# Patient Record
Sex: Female | Born: 1950 | Race: Black or African American | Hispanic: No | Marital: Single | Smoking: Never smoker
Health system: Southern US, Community
[De-identification: ages and names within clinical notes are randomized; demographics above are authoritative.]

## PROBLEM LIST (undated history)

## (undated) DIAGNOSIS — D649 Anemia, unspecified: Secondary | ICD-10-CM

## (undated) DIAGNOSIS — Z8489 Family history of other specified conditions: Secondary | ICD-10-CM

## (undated) HISTORY — PX: ABDOMINAL HYSTERECTOMY: SHX81

---

## 2016-09-02 ENCOUNTER — Ambulatory Visit: Payer: Self-pay | Admitting: Orthopedic Surgery

## 2016-09-03 ENCOUNTER — Ambulatory Visit: Payer: Self-pay | Admitting: Orthopedic Surgery

## 2016-09-04 ENCOUNTER — Ambulatory Visit: Payer: Self-pay | Admitting: Orthopedic Surgery

## 2016-09-04 NOTE — H&P (Signed)
[name] is an 66 y.o. female.   Chief Complaint: back and L leg pain HPI: The patient is a 66 year old female who presents with back pain. The patient is here today in referral from Dr. Ethelene Hal. 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 Symptoms are reported to be located in the low back and Symptoms include pain, numbness, tingling and weakness (bilateral lower extremities). The pain radiates to the left posterior thigh and right posterior thigh. The patient describes the pain as sharp, aching and throbbing. The patient describes the severity of their symptoms as 9 / 10. Symptoms are exacerbated by standing. Current treatment includes nonsteroidal anti-inflammatory drugs (Ibuprofen) and activity modification. Prior to being seen today the patient was previously evaluated by Dr. Ethelene Hal. Past evaluation has included MRI of the lumbar spine. Past treatment has included nonsteroidal anti-inflammatory drugs, activity modification, physical therapy and chiropractic manipulation (years ago).  [name] is here for second opinion. Reports pain when she stands, better when she sits, mainly radiates on the left anterior thigh. She also has numbness and tingling in the bottom of the feet and the posterior aspect of the leg. She is here with her husband. She was seen at [facility]. She is seen by a doctor there. She wanted a second opinion and wanted more information. She has had an MRI. She has had x-rays. She has had formal supervised physical therapy, though that occurred prior to her x-rays or her MRI.  I reviewed her outside medical records, an extensive review. MRI done on 06/11/2016 indicating foraminal disc protrusion at L3-4 contacting the L3 root with severe stenosis.  She had a grade 1 listhesis at L3-4. Disc degeneration at L5-S1.  Osteoarthritis was noted in her right hip.  Dr. Sueanne Margarita discussed these findings with her and gave her options including a transforaminal injection, physical therapy, activity modification, and possible surgery.  She is here with her husband. She is not quite interested in epidural steroid injections. She does not have significant groin pain.  REVIEW OF SYSTEMS Review of systems is negative for fevers, chest pain, shortness of breath, unexplained recent weight loss, loss of bowel or bladder function, burning with urination, joint swelling, rashes, weakness or numbness, difficulty with balance, easy bruising, excessive thirst or frequent urination.  Family History Cerebrovascular Accident  Maternal Grandfather, Maternal Grandmother. Diabetes Mellitus  Mother. Hypertension  Father, Mother. Rheumatoid Arthritis  Mother. First Degree Relatives   Social History Tobacco use  Never smoker. 08/14/2016 Children  1 Current drinker  08/14/2016: Currently drinks wine only occasionally per week Current work status  working part time Exercise  Exercises weekly; does gym / [employer] Living situation  live alone Marital status  divorced No history of drug/alcohol rehab  Not under pain contract  Number of flights of stairs before winded  greater than 5 Tobacco / smoke exposure  08/14/2016: no  Medication History Ibuprofen (200MG  Capsule, Oral) Active.  Pregnancy / Birth History Pregnant  no  Past Surgical History Appendectomy  Hysterectomy  partial (non-cancerous)  Past Medical Hx No pertinent past medical hx  Allergies: NKDA  Review of Systems  Constitutional: Negative.   HENT: Negative.   Eyes: Negative.   Respiratory: Negative.   Cardiovascular: Negative.   Gastrointestinal: Negative.   Genitourinary: Negative.   Musculoskeletal: Positive for back pain.  Skin: Negative.  Neurological: Positive for sensory change and focal weakness.   Psychiatric/Behavioral: Negative.     There were no vitals taken for this visit. Physical Exam  Constitutional: She is oriented to person, place, and time. She appears well-developed.  HENT:  Head: Normocephalic.  Eyes: Pupils are equal, round, and reactive to light.  Neck: Normal range of motion.  Cardiovascular: Normal rate.   Respiratory: Effort normal.  GI: Soft.  Musculoskeletal:  Healthy, in no distress. Mood and affect is appropriate. Walks with slightly antalgic gait, forward flexed. Straight leg raise is actually negative bilaterally. She has some slight hip flexor weakness on the left compared to the right.  She has pain with extension and relieved with forward flexion of the lumbar spine.  Lumbar spine exam reveals no evidence of soft tissue swelling, deformity or skin ecchymosis. On palpation there is no tenderness of the lumbar spine. No flank pain with percussion. The abdomen is soft and nontender. Nontender over the trochanters. No cellulitis or lymphadenopathy.  Good range of motion of the lumbar spine without associated pain. Motor is 5/5 including EHL, tibialis anterior, plantar flexion, quadriceps and hamstrings. Patient is normoreflexic. There is no Babinski or clonus. Sensory exam is intact to light touch. Patient has good distal pulses. No DVT. No pain and normal range of motion without instability of the knees and ankles.  Painless range of motion of the cervical spine.  Neurological: She is alert and oriented to person, place, and time. She has normal reflexes.  Skin: Skin is warm and dry.    AP, lateral, flexion and extension radiographs of the lumbar spine demonstrate a mild thoracolumbar scoliosis, grade 1 listhesis at L3-4, minimal instability in flexion and extension. End-stage disc degeneration at L5-S, moderate at L3-4.  I reviewed her outside MRI from [facility]. Severe multifactorial stenosis at L3-4. Neural foraminal stenosis with disc  protrusion at L3-4.  Assessment/Plan 1. 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 2. Spondylolisthesis at L3-4. 3. Disc protrusion at L3-4, left. 4. 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 5. Osteoarthrosis of the right hip.  We had extensive discussion concerning current pathology, relevant anatomy, and treatment options reviewing her treatment to date, her x-rays, and MRI. Forty five minutes dedicated to this full discussion and in presentation. We discussed Williams flexion program in terms of physical therapy and at length activity modification to avoid extension and precipitation of her claudication by favoring forward flexion.  Additionally, we did discuss analgesics as well as possible epidural versus selective nerve root blocks at L3 and L4. They are not interested in epidural steroid injections. In terms of surgical intervention, I feel that predominant of her symptoms are secondary to the severe stenosis at L3-4, L4-5, and L5-S1 that do not appear to be the main pain generator. Her main problem is neurogenic claudication in terms of the left lower extremity radicular pain, dysesthesia in the plantar aspect of the feet that basically resolves when she sits and presents when she stands. I would feel any surgical invention would required decompression at L3-4, minimum lateral mass fusion, and possible instrumentation. She is otherwise pretty healthy and low comorbidities. I have spent considerable time in discussions. She will contemplate her options. We will call her in a week. I would like to determine whether she would be a candidate for an XLIF. I will discuss this with Dr. Shon Baton and then will call them back and discuss further options. We discussed surgical procedure, time in the hospital, and  the postoperative course.  Dorothy SparkBISSELL, Kianah Harries M., PA-C for Dr. Shelle IronBeane 09/04/2016, 9:20 AM

## 2016-09-15 NOTE — Patient Instructions (Signed)
Randa EvensJudy Mosso  09/15/2016   Your procedure is scheduled on: 09/24/2016    [facility]  [place] to 3rd floor to  Short Stay Center at    0630 AM.  Call this number if you have problems the morning of surgery [phone]   Remember: ONLY 1 PERSON MAY GO WITH YOU TO SHORT STAY TO GET  READY MORNING OF YOUR SURGERY.  Do not eat food or drink liquids :After Midnight.     Take these medicines the morning of surgery with A SIP OF WATER: none                                 You may not have any metal on your body including hair pins and              piercings  Do not wear jewelry, make-up, lotions, powders or perfumes, deodorant             Do not wear nail polish.  Do not shave  48 hours prior to surgery.               Do not bring valuables to the hospital. [place] IS NOT             RESPONSIBLE   FOR VALUABLES.  Contacts, dentures or bridgework may not be worn into surgery.  Leave suitcase in the car. After surgery it may be brought to your room.                      Please read over the following fact sheets you were given: _____________________________________________________________________             [facility] - Pre[facility] Before surgery, you can play an important role.  Because skin is not sterile, your skin needs to be as free of germs as possible.  You can reduce the number of germs on your skin by washing with CHG (chlorahexidine gluconate) soap before surgery.  CHG is an antiseptic cleaner which kills germs and bonds with the skin to continue killing germs even after washing. Please DO NOT use if you have an allergy to CHG or antibacterial soaps.  If your skin becomes reddened/irritated stop using the CHG and inform your nurse when you arrive at Short Stay. Do not shave (including legs and underarms) for at least 48 hours prior to the first CHG shower.  You may shave your face/neck. Please follow  these instructions carefully:  1.  Shower with CHG Soap the night before surgery and the  morning of Surgery.  2.  If you choose to wash your hair, wash your hair first as usual with your  normal  shampoo.  3.  After you shampoo, rinse your hair and body thoroughly to remove the  shampoo.                           4.  Use CHG as you would any other liquid soap.  You can apply chg directly  to the skin and wash                       Gently with a scrungie or clean washcloth.  5.  Apply the  CHG Soap to your body ONLY FROM THE NECK DOWN.   Do not use on face/ open                           Wound or open sores. Avoid contact with eyes, ears mouth and genitals (private parts).                       Wash face,  Genitals (private parts) with your normal soap.             6.  Wash thoroughly, paying special attention to the area where your surgery  will be performed.  7.  Thoroughly rinse your body with warm water from the neck down.  8.  DO NOT shower/wash with your normal soap after using and rinsing off  the CHG Soap.                9.  Pat yourself dry with a clean towel.            10.  Wear clean pajamas.            11.  Place clean sheets on your bed the night of your first shower and do not  sleep with pets. Day of Surgery : Do not apply any lotions/deodorants the morning of surgery.  Please wear clean clothes to the hospital/surgery center.  FAILURE TO FOLLOW THESE INSTRUCTIONS MAY RESULT IN THE CANCELLATION OF YOUR SURGERY PATIENT SIGNATURE_________________________________  NURSE SIGNATURE__________________________________  ________________________________________________________________________   Adam Phenix  An incentive spirometer is a tool that can help keep your lungs clear and active. This tool measures how well you are filling your lungs with each breath. Taking long deep breaths may help reverse or decrease the chance of developing breathing (pulmonary) problems  (especially infection) following:  A long period of time when you are unable to move or be active. BEFORE THE PROCEDURE   If the spirometer includes an indicator to show your best effort, your nurse or respiratory therapist will set it to a desired goal.  If possible, sit up straight or lean slightly forward. Try not to slouch.  Hold the incentive spirometer in an upright position. INSTRUCTIONS FOR USE  1. Sit on the edge of your bed if possible, or sit up as far as you can in bed or on a chair. 2. Hold the incentive spirometer in an upright position. 3. Breathe out normally. 4. Place the mouthpiece in your mouth and seal your lips tightly around it. 5. Breathe in slowly and as deeply as possible, raising the piston or the ball toward the top of the column. 6. Hold your breath for 3-5 seconds or for as long as possible. Allow the piston or ball to fall to the bottom of the column. 7. Remove the mouthpiece from your mouth and breathe out normally. 8. Rest for a few seconds and repeat Steps 1 through 7 at least 10 times every 1-2 hours when you are awake. Take your time and take a few normal breaths between deep breaths. 9. The spirometer may include an indicator to show your best effort. Use the indicator as a goal to work toward during each repetition. 10. After each set of 10 deep breaths, practice coughing to be sure your lungs are clear. If you have an incision (the cut made at the time of surgery), support your incision when coughing by placing a pillow or rolled up  towels firmly against it. Once you are able to get out of bed, walk around indoors and cough well. You may stop using the incentive spirometer when instructed by your caregiver.  RISKS AND COMPLICATIONS  Take your time so you do not get dizzy or light-headed.  If you are in pain, you may need to take or ask for pain medication before doing incentive spirometry. It is harder to take a deep breath if you are having  pain. AFTER USE  Rest and breathe slowly and easily.  It can be helpful to keep track of a log of your progress. Your caregiver can provide you with a simple table to help with this. If you are using the spirometer at home, follow these instructions: SEEK MEDICAL CARE IF:   You are having difficultly using the spirometer.  You have trouble using the spirometer as often as instructed.  Your pain medication is not giving enough relief while using the spirometer.  You develop fever of 100.5 F (38.1 C) or higher. SEEK IMMEDIATE MEDICAL CARE IF:   You cough up bloody sputum that had not been present before.  You develop fever of 102 F (38.9 C) or greater.  You develop worsening pain at or near the incision site. MAKE SURE YOU:   Understand these instructions.  Will watch your condition.  Will get help right away if you are not doing well or get worse. Document Released: 11/17/2006 Document Revised: 09/29/2011 Document Reviewed: 01/18/2007 [facility] Patient Information 2014 [employer] [place].   ________________________________________________________________________

## 2016-09-17 ENCOUNTER — Encounter (HOSPITAL_COMMUNITY): Payer: Self-pay | Admitting: *Deleted

## 2016-09-17 ENCOUNTER — Encounter (HOSPITAL_COMMUNITY)
Admission: RE | Admit: 2016-09-17 | Discharge: 2016-09-17 | Disposition: A | Discharge status: Home / Self Care | Payer: Medicare Other | Source: Ambulatory Visit | Attending: Specialist | Admitting: Specialist

## 2016-09-17 ENCOUNTER — Ambulatory Visit (HOSPITAL_COMMUNITY)
Admission: RE | Admit: 2016-09-17 | Discharge: 2016-09-17 | Disposition: A | Discharge status: Home / Self Care | Payer: Medicare Other | Source: Ambulatory Visit | Attending: Orthopedic Surgery | Admitting: Orthopedic Surgery

## 2016-09-17 DIAGNOSIS — Z791 Long term (current) use of non-steroidal anti-inflammatories (NSAID): Secondary | ICD-10-CM | POA: Diagnosis not present

## 2016-09-17 DIAGNOSIS — M5126 Other intervertebral disc displacement, lumbar region: Secondary | ICD-10-CM | POA: Insufficient documentation

## 2016-09-17 DIAGNOSIS — M48062 Spinal stenosis, lumbar region with neurogenic claudication: Secondary | ICD-10-CM | POA: Diagnosis present

## 2016-09-17 DIAGNOSIS — M4316 Spondylolisthesis, lumbar region: Secondary | ICD-10-CM | POA: Diagnosis not present

## 2016-09-17 HISTORY — DX: Family history of other specified conditions: Z84.89

## 2016-09-17 HISTORY — DX: Anemia, unspecified: D64.9

## 2016-09-17 LAB — CBC
HCT: 41.1 % (ref 36.0–46.0)
Hemoglobin: 13.7 g/dL (ref 12.0–15.0)
MCH: 30.2 pg (ref 26.0–34.0)
MCHC: 33.3 g/dL (ref 30.0–36.0)
MCV: 90.7 fL (ref 78.0–100.0)
Platelets: 219 10*3/uL (ref 150–400)
RBC: 4.53 MIL/uL (ref 3.87–5.11)
RDW: 13.2 % (ref 11.5–15.5)
WBC: 5.3 10*3/uL (ref 4.0–10.5)

## 2016-09-17 LAB — BASIC METABOLIC PANEL
Anion gap: 6 (ref 5–15)
BUN: 18 mg/dL (ref 6–20)
CO2: 27 mmol/L (ref 22–32)
CREATININE: 0.78 mg/dL (ref 0.44–1.00)
Calcium: 9.8 mg/dL (ref 8.9–10.3)
Chloride: 108 mmol/L (ref 101–111)
GFR calc non Af Amer: >60 mL/min (ref >60)
Glucose, Bld: 95 mg/dL (ref 65–99)
Potassium: 4.3 mmol/L (ref 3.5–5.1)
SODIUM: 141 mmol/L (ref 135–145)

## 2016-09-17 LAB — ABO/RH: ABO/RH(D): O POS

## 2016-09-17 LAB — SURGICAL PCR SCREEN
MRSA, PCR: NEGATIVE
Staphylococcus aureus: NEGATIVE

## 2016-09-24 ENCOUNTER — Ambulatory Visit (HOSPITAL_COMMUNITY): Payer: Medicare Other | Admitting: Anesthesiology

## 2016-09-24 ENCOUNTER — Encounter (HOSPITAL_COMMUNITY): Payer: Self-pay | Admitting: Orthopedic Surgery

## 2016-09-24 ENCOUNTER — Ambulatory Visit (HOSPITAL_COMMUNITY): Payer: Medicare Other

## 2016-09-24 ENCOUNTER — Encounter (HOSPITAL_COMMUNITY)
Admission: RE | Disposition: A | Discharge status: Home / Self Care | Payer: Self-pay | Source: Ambulatory Visit | Attending: Specialist

## 2016-09-24 ENCOUNTER — Observation Stay (HOSPITAL_COMMUNITY)
Admission: RE | Admit: 2016-09-24 | Discharge: 2016-09-27 | Disposition: A | Discharge status: Home / Self Care | Payer: Medicare Other | Source: Ambulatory Visit | Attending: Specialist | Admitting: Specialist

## 2016-09-24 DIAGNOSIS — M4316 Spondylolisthesis, lumbar region: Secondary | ICD-10-CM | POA: Diagnosis not present

## 2016-09-24 DIAGNOSIS — M48062 Spinal stenosis, lumbar region with neurogenic claudication: Secondary | ICD-10-CM | POA: Diagnosis not present

## 2016-09-24 DIAGNOSIS — Z791 Long term (current) use of non-steroidal anti-inflammatories (NSAID): Secondary | ICD-10-CM | POA: Insufficient documentation

## 2016-09-24 DIAGNOSIS — M549 Dorsalgia, unspecified: Secondary | ICD-10-CM

## 2016-09-24 DIAGNOSIS — M48061 Spinal stenosis, lumbar region without neurogenic claudication: Secondary | ICD-10-CM

## 2016-09-24 DIAGNOSIS — M5126 Other intervertebral disc displacement, lumbar region: Secondary | ICD-10-CM

## 2016-09-24 HISTORY — PX: LUMBAR LAMINECTOMY/DECOMPRESSION MICRODISCECTOMY: SHX5026

## 2016-09-24 SURGERY — LUMBAR LAMINECTOMY/DECOMPRESSION MICRODISCECTOMY 1 LEVEL
Anesthesia: General | Site: Back | Laterality: Bilateral

## 2016-09-24 MED ORDER — ROCURONIUM BROMIDE 10 MG/ML (PF) SYRINGE
PREFILLED_SYRINGE | INTRAVENOUS | Status: DC | PRN
  Administered 2016-09-24 (×2): 10 mg via INTRAVENOUS
  Administered 2016-09-24: 40 mg via INTRAVENOUS

## 2016-09-24 MED ORDER — ONDANSETRON HCL 4 MG PO TABS
4.0000 mg | ORAL_TABLET | Freq: Four times a day (QID) | ORAL | Status: DC | PRN
  Administered 2016-09-24: 4 mg via ORAL
  Filled 2016-09-24: qty 1

## 2016-09-24 MED ORDER — SUGAMMADEX SODIUM 200 MG/2ML IV SOLN
INTRAVENOUS | Status: DC | PRN
  Administered 2016-09-24: 200 mg via INTRAVENOUS

## 2016-09-24 MED ORDER — ONDANSETRON HCL 4 MG/2ML IJ SOLN
4.0000 mg | Freq: Four times a day (QID) | INTRAMUSCULAR | Status: DC | PRN
  Administered 2016-09-24 – 2016-09-25 (×2): 4 mg via INTRAVENOUS
  Filled 2016-09-24 (×2): qty 2

## 2016-09-24 MED ORDER — LACTATED RINGERS IV SOLN
INTRAVENOUS | Status: DC
  Administered 2016-09-24 (×3): via INTRAVENOUS

## 2016-09-24 MED ORDER — ROCURONIUM BROMIDE 50 MG/5ML IV SOSY
PREFILLED_SYRINGE | INTRAVENOUS | Status: AC
  Filled 2016-09-24: qty 5

## 2016-09-24 MED ORDER — DOXYLAMINE SUCCINATE (SLEEP) 25 MG PO TABS
25.0000 mg | ORAL_TABLET | Freq: Every day | ORAL | Status: DC
  Filled 2016-09-24 (×3): qty 1

## 2016-09-24 MED ORDER — SODIUM CHLORIDE 0.9 % IR SOLN
Status: DC | PRN
  Administered 2016-09-24: 500 mL

## 2016-09-24 MED ORDER — PHENYLEPHRINE 40 MCG/ML (10ML) SYRINGE FOR IV PUSH (FOR BLOOD PRESSURE SUPPORT)
PREFILLED_SYRINGE | INTRAVENOUS | Status: DC | PRN
  Administered 2016-09-24 (×4): 80 ug via INTRAVENOUS

## 2016-09-24 MED ORDER — PROPOFOL 10 MG/ML IV BOLUS
INTRAVENOUS | Status: DC | PRN
  Administered 2016-09-24: 160 mg via INTRAVENOUS

## 2016-09-24 MED ORDER — MIDAZOLAM HCL 5 MG/5ML IJ SOLN
INTRAMUSCULAR | Status: DC | PRN
  Administered 2016-09-24: 2 mg via INTRAVENOUS

## 2016-09-24 MED ORDER — ALUM & MAG HYDROXIDE-SIMETH 200-200-20 MG/5ML PO SUSP: 30.0000 mL | Freq: Four times a day (QID) | ORAL | Status: DC | PRN

## 2016-09-24 MED ORDER — MIDAZOLAM HCL 2 MG/2ML IJ SOLN
INTRAMUSCULAR | Status: AC
  Filled 2016-09-24: qty 2

## 2016-09-24 MED ORDER — THROMBIN 5000 UNITS EX SOLR
CUTANEOUS | Status: AC
  Filled 2016-09-24: qty 10000

## 2016-09-24 MED ORDER — ONDANSETRON HCL 4 MG/2ML IJ SOLN
INTRAMUSCULAR | Status: DC | PRN
  Administered 2016-09-24: 4 mg via INTRAVENOUS

## 2016-09-24 MED ORDER — RISAQUAD PO CAPS
1.0000 | ORAL_CAPSULE | Freq: Every day | ORAL | Status: DC
  Administered 2016-09-25 – 2016-09-27 (×3): 1 via ORAL
  Filled 2016-09-24 (×3): qty 1

## 2016-09-24 MED ORDER — PHENYLEPHRINE 40 MCG/ML (10ML) SYRINGE FOR IV PUSH (FOR BLOOD PRESSURE SUPPORT)
PREFILLED_SYRINGE | INTRAVENOUS | Status: AC
  Filled 2016-09-24: qty 10

## 2016-09-24 MED ORDER — BUPIVACAINE-EPINEPHRINE 0.5% -1:200000 IJ SOLN
INTRAMUSCULAR | Status: DC | PRN
  Administered 2016-09-24: 13 mL

## 2016-09-24 MED ORDER — ONDANSETRON HCL 4 MG/2ML IJ SOLN
INTRAMUSCULAR | Status: AC
  Filled 2016-09-24: qty 2

## 2016-09-24 MED ORDER — PROPOFOL 10 MG/ML IV BOLUS
INTRAVENOUS | Status: AC
  Filled 2016-09-24: qty 20

## 2016-09-24 MED ORDER — DEXAMETHASONE SODIUM PHOSPHATE 10 MG/ML IJ SOLN
INTRAMUSCULAR | Status: DC | PRN
  Administered 2016-09-24: 10 mg via INTRAVENOUS

## 2016-09-24 MED ORDER — MENTHOL 3 MG MT LOZG: 1.0000 | LOZENGE | OROMUCOSAL | Status: DC | PRN

## 2016-09-24 MED ORDER — PHENYLEPHRINE HCL 10 MG/ML IJ SOLN
INTRAMUSCULAR | Status: AC
  Filled 2016-09-24: qty 1

## 2016-09-24 MED ORDER — POLYETHYLENE GLYCOL 3350 17 G PO PACK: 17.0000 g | PACK | Freq: Every day | ORAL | Status: DC | PRN

## 2016-09-24 MED ORDER — PHENYLEPHRINE HCL 10 MG/ML IJ SOLN
INTRAVENOUS | Status: DC | PRN
  Administered 2016-09-24: 25 ug/min via INTRAVENOUS

## 2016-09-24 MED ORDER — SODIUM CHLORIDE 0.9 % IR SOLN
Status: AC
  Filled 2016-09-24: qty 500000

## 2016-09-24 MED ORDER — DOCUSATE SODIUM 100 MG PO CAPS: 100.0000 mg | ORAL_CAPSULE | Freq: Two times a day (BID) | ORAL | 1 refills | Status: AC | PRN

## 2016-09-24 MED ORDER — MAGNESIUM CITRATE PO SOLN: 1.0000 | Freq: Once | ORAL | Status: DC | PRN

## 2016-09-24 MED ORDER — HYDROMORPHONE HCL 1 MG/ML IJ SOLN
0.2500 mg | INTRAMUSCULAR | Status: DC | PRN
  Administered 2016-09-24 (×2): 0.5 mg via INTRAVENOUS

## 2016-09-24 MED ORDER — FENTANYL CITRATE (PF) 100 MCG/2ML IJ SOLN
INTRAMUSCULAR | Status: DC | PRN
  Administered 2016-09-24: 50 ug via INTRAVENOUS

## 2016-09-24 MED ORDER — SUGAMMADEX SODIUM 200 MG/2ML IV SOLN
INTRAVENOUS | Status: AC
  Filled 2016-09-24: qty 2

## 2016-09-24 MED ORDER — DEXAMETHASONE SODIUM PHOSPHATE 10 MG/ML IJ SOLN
INTRAMUSCULAR | Status: AC
  Filled 2016-09-24: qty 1

## 2016-09-24 MED ORDER — THROMBIN 5000 UNITS EX SOLR
CUTANEOUS | Status: DC | PRN
  Administered 2016-09-24: 10000 mL via TOPICAL

## 2016-09-24 MED ORDER — FENTANYL CITRATE (PF) 100 MCG/2ML IJ SOLN
INTRAMUSCULAR | Status: AC
  Filled 2016-09-24: qty 2

## 2016-09-24 MED ORDER — OXYCODONE-ACETAMINOPHEN 5-325 MG PO TABS: 1.0000 | ORAL_TABLET | ORAL | 0 refills | Status: AC | PRN

## 2016-09-24 MED ORDER — ACETAMINOPHEN 10 MG/ML IV SOLN
1000.0000 mg | Freq: Once | INTRAVENOUS | Status: AC
  Administered 2016-09-24: 1000 mg via INTRAVENOUS

## 2016-09-24 MED ORDER — BISACODYL 5 MG PO TBEC: 5.0000 mg | DELAYED_RELEASE_TABLET | Freq: Every day | ORAL | Status: DC | PRN

## 2016-09-24 MED ORDER — ACETAMINOPHEN 325 MG PO TABS
650.0000 mg | ORAL_TABLET | ORAL | Status: DC | PRN
  Administered 2016-09-25 – 2016-09-26 (×5): 650 mg via ORAL
  Filled 2016-09-24 (×6): qty 2

## 2016-09-24 MED ORDER — LIDOCAINE 2% (20 MG/ML) 5 ML SYRINGE
INTRAMUSCULAR | Status: DC | PRN
  Administered 2016-09-24: 100 mg via INTRAVENOUS

## 2016-09-24 MED ORDER — OXYCODONE HCL 5 MG PO TABS
5.0000 mg | ORAL_TABLET | ORAL | Status: DC | PRN
  Administered 2016-09-24 – 2016-09-27 (×5): 5 mg via ORAL
  Administered 2016-09-27: 10 mg via ORAL
  Filled 2016-09-24 (×2): qty 1
  Filled 2016-09-24: qty 2
  Filled 2016-09-24 (×2): qty 1
  Filled 2016-09-24: qty 2

## 2016-09-24 MED ORDER — CEFAZOLIN SODIUM-DEXTROSE 2-4 GM/100ML-% IV SOLN
2.0000 g | Freq: Three times a day (TID) | INTRAVENOUS | Status: AC
  Administered 2016-09-24 – 2016-09-25 (×3): 2 g via INTRAVENOUS
  Filled 2016-09-24 (×3): qty 100

## 2016-09-24 MED ORDER — HYDROMORPHONE HCL 1 MG/ML IJ SOLN
INTRAMUSCULAR | Status: AC
  Administered 2016-09-24: 0.5 mg via INTRAVENOUS
  Filled 2016-09-24: qty 1

## 2016-09-24 MED ORDER — SUCCINYLCHOLINE CHLORIDE 200 MG/10ML IV SOSY
PREFILLED_SYRINGE | INTRAVENOUS | Status: DC | PRN
  Administered 2016-09-24: 120 mg via INTRAVENOUS

## 2016-09-24 MED ORDER — CEFAZOLIN SODIUM-DEXTROSE 2-4 GM/100ML-% IV SOLN
2.0000 g | INTRAVENOUS | Status: AC
  Administered 2016-09-24: 2 g via INTRAVENOUS

## 2016-09-24 MED ORDER — SUCCINYLCHOLINE CHLORIDE 200 MG/10ML IV SOSY
PREFILLED_SYRINGE | INTRAVENOUS | Status: AC
  Filled 2016-09-24: qty 10

## 2016-09-24 MED ORDER — ACETAMINOPHEN 10 MG/ML IV SOLN
INTRAVENOUS | Status: AC
  Filled 2016-09-24: qty 100

## 2016-09-24 MED ORDER — HYDROMORPHONE HCL 1 MG/ML IJ SOLN
INTRAMUSCULAR | Status: AC
  Filled 2016-09-24: qty 1

## 2016-09-24 MED ORDER — MEPERIDINE HCL 50 MG/ML IJ SOLN: 6.2500 mg | INTRAMUSCULAR | Status: DC | PRN

## 2016-09-24 MED ORDER — PROMETHAZINE HCL 25 MG/ML IJ SOLN: 6.2500 mg | INTRAMUSCULAR | Status: DC | PRN

## 2016-09-24 MED ORDER — POLYETHYLENE GLYCOL 3350 17 G PO PACK: 17.0000 g | PACK | Freq: Every day | ORAL | 0 refills | Status: AC

## 2016-09-24 MED ORDER — KCL IN DEXTROSE-NACL 20-5-0.45 MEQ/L-%-% IV SOLN
INTRAVENOUS | Status: AC
  Administered 2016-09-24 – 2016-09-25 (×2): via INTRAVENOUS
  Filled 2016-09-24 (×3): qty 1000

## 2016-09-24 MED ORDER — METHOCARBAMOL 1000 MG/10ML IJ SOLN
500.0000 mg | Freq: Four times a day (QID) | INTRAVENOUS | Status: DC | PRN
  Administered 2016-09-24: 500 mg via INTRAVENOUS
  Filled 2016-09-24: qty 5
  Filled 2016-09-24: qty 550

## 2016-09-24 MED ORDER — PHENOL 1.4 % MT LIQD: 1.0000 | OROMUCOSAL | Status: DC | PRN

## 2016-09-24 MED ORDER — HYDROMORPHONE HCL 1 MG/ML IJ SOLN
0.5000 mg | INTRAMUSCULAR | Status: DC | PRN
  Filled 2016-09-24: qty 0.5

## 2016-09-24 MED ORDER — LIDOCAINE 2% (20 MG/ML) 5 ML SYRINGE
INTRAMUSCULAR | Status: AC
  Filled 2016-09-24: qty 5

## 2016-09-24 MED ORDER — CEFAZOLIN SODIUM-DEXTROSE 2-4 GM/100ML-% IV SOLN
INTRAVENOUS | Status: AC
  Filled 2016-09-24: qty 100

## 2016-09-24 MED ORDER — DOCUSATE SODIUM 100 MG PO CAPS
100.0000 mg | ORAL_CAPSULE | Freq: Two times a day (BID) | ORAL | Status: DC
  Administered 2016-09-24 – 2016-09-27 (×6): 100 mg via ORAL
  Filled 2016-09-24 (×6): qty 1

## 2016-09-24 MED ORDER — ACETAMINOPHEN 650 MG RE SUPP: 650.0000 mg | RECTAL | Status: DC | PRN

## 2016-09-24 MED ORDER — METHOCARBAMOL 500 MG PO TABS
500.0000 mg | ORAL_TABLET | Freq: Four times a day (QID) | ORAL | Status: DC | PRN
  Administered 2016-09-24 – 2016-09-27 (×7): 500 mg via ORAL
  Filled 2016-09-24 (×7): qty 1

## 2016-09-24 MED ORDER — BUPIVACAINE-EPINEPHRINE (PF) 0.5% -1:200000 IJ SOLN
INTRAMUSCULAR | Status: AC
  Filled 2016-09-24: qty 30

## 2016-09-24 SURGICAL SUPPLY — 49 items
BAG ZIPLOCK 12X15 (MISCELLANEOUS) IMPLANT
CLEANER TIP ELECTROSURG 2X2 (MISCELLANEOUS) ×2 IMPLANT
CLOTH 2% CHLOROHEXIDINE 3PK (PERSONAL CARE ITEMS) ×2 IMPLANT
DRAPE MICROSCOPE LEICA (MISCELLANEOUS) ×2 IMPLANT
DRAPE POUCH INSTRU U-SHP 10X18 (DRAPES) IMPLANT
DRAPE SHEET LG 3/4 BI-LAMINATE (DRAPES) ×2 IMPLANT
DRAPE SURG 17X11 SM STRL (DRAPES) ×2 IMPLANT
DRAPE UTILITY XL STRL (DRAPES) ×2 IMPLANT
DRSG AQUACEL AG ADV 3.5X 4 (GAUZE/BANDAGES/DRESSINGS) IMPLANT
DRSG AQUACEL AG ADV 3.5X 6 (GAUZE/BANDAGES/DRESSINGS) ×4 IMPLANT
DURAPREP 26ML APPLICATOR (WOUND CARE) ×2 IMPLANT
DURASEAL SPINE SEALANT 3ML (MISCELLANEOUS) IMPLANT
ELECT BLADE TIP CTD 4 INCH (ELECTRODE) ×2 IMPLANT
ELECT REM PT RETURN 9FT ADLT (ELECTROSURGICAL) ×2
ELECTRODE REM PT RTRN 9FT ADLT (ELECTROSURGICAL) ×1 IMPLANT
GLOVE BIOGEL PI IND STRL 7.0 (GLOVE) ×1 IMPLANT
GLOVE BIOGEL PI INDICATOR 7.0 (GLOVE) ×1
GLOVE SURG SS PI 7.0 STRL IVOR (GLOVE) ×2 IMPLANT
GLOVE SURG SS PI 7.5 STRL IVOR (GLOVE) ×4 IMPLANT
GLOVE SURG SS PI 8.0 STRL IVOR (GLOVE) ×4 IMPLANT
GOWN STRL REUS W/TWL XL LVL3 (GOWN DISPOSABLE) ×4 IMPLANT
GRAFT IC CHAMBER LG (Bone Implant) ×2 IMPLANT
HEMOSTAT SPONGE AVITENE ULTRA (HEMOSTASIS) IMPLANT
IV CATH 14GX2 1/4 (CATHETERS) ×2 IMPLANT
KIT BASIN OR (CUSTOM PROCEDURE TRAY) ×2 IMPLANT
KIT POSITIONING SURG ANDREWS (MISCELLANEOUS) ×2 IMPLANT
MANIFOLD NEPTUNE II (INSTRUMENTS) ×2 IMPLANT
NEEDLE SPNL 18GX3.5 QUINCKE PK (NEEDLE) ×4 IMPLANT
PACK LAMINECTOMY ORTHO (CUSTOM PROCEDURE TRAY) ×2 IMPLANT
PATTIES SURGICAL .5 X.5 (GAUZE/BANDAGES/DRESSINGS) IMPLANT
PATTIES SURGICAL .75X.75 (GAUZE/BANDAGES/DRESSINGS) ×2 IMPLANT
PATTIES SURGICAL 1X1 (DISPOSABLE) IMPLANT
RUBBERBAND STERILE (MISCELLANEOUS) ×4 IMPLANT
SPONGE LAP 4X18 X RAY DECT (DISPOSABLE) ×2 IMPLANT
SPONGE SURGIFOAM ABS GEL 100 (HEMOSTASIS) ×2 IMPLANT
STAPLER VISISTAT (STAPLE) ×2 IMPLANT
STRIP CLOSURE SKIN 1/2X4 (GAUZE/BANDAGES/DRESSINGS) ×2 IMPLANT
SUT NURALON 4 0 TR CR/8 (SUTURE) IMPLANT
SUT PROLENE 3 0 PS 2 (SUTURE) ×2 IMPLANT
SUT VIC AB 1 CT1 27 (SUTURE)
SUT VIC AB 1 CT1 27XBRD ANTBC (SUTURE) IMPLANT
SUT VIC AB 1-0 CT2 27 (SUTURE) ×4 IMPLANT
SUT VIC AB 2-0 CT1 27 (SUTURE)
SUT VIC AB 2-0 CT1 TAPERPNT 27 (SUTURE) IMPLANT
SUT VIC AB 2-0 CT2 27 (SUTURE) ×2 IMPLANT
SYR 3ML LL SCALE MARK (SYRINGE) ×2 IMPLANT
TOWEL OR 17X26 10 PK STRL BLUE (TOWEL DISPOSABLE) ×2 IMPLANT
TOWEL OR NON WOVEN STRL DISP B (DISPOSABLE) ×2 IMPLANT
YANKAUER SUCT BULB TIP NO VENT (SUCTIONS) ×2 IMPLANT

## 2016-09-24 NOTE — Anesthesia Procedure Notes (Signed)
Procedure Name: Intubation Date/Time: 09/24/2016 8:34 AM Performed by: Lind Covert Pre-anesthesia Checklist: Patient identified, Timeout performed, Emergency Drugs available, Patient being monitored and Suction available Patient Re-evaluated:Patient Re-evaluated prior to inductionOxygen Delivery Method: Circle system utilized Preoxygenation: Pre-oxygenation with 100% oxygen Intubation Type: IV induction Laryngoscope Size: Mac and 4 Grade View: Grade I Tube type: Oral Tube size: 7.0 mm Number of attempts: 1 Airway Equipment and Method: Stylet Placement Confirmation: ETT inserted through vocal cords under direct vision,  positive ETCO2 and breath sounds checked- equal and bilateral Secured at: 22 cm Tube secured with: Tape Dental Injury: Teeth and Oropharynx as per pre-operative assessment

## 2016-09-24 NOTE — Anesthesia Preprocedure Evaluation (Signed)
Anesthesia Evaluation  Patient identified by MRN, date of birth, ID band Patient awake    Reviewed: Allergy & Precautions, NPO status , Patient's Chart, lab work & pertinent test results  Airway Mallampati: II  TM Distance: >3 FB Neck ROM: Full    Dental no notable dental hx.    Pulmonary neg pulmonary ROS,    Pulmonary exam normal breath sounds clear to auscultation       Cardiovascular negative cardio ROS Normal cardiovascular exam Rhythm:Regular Rate:Normal     Neuro/Psych negative neurological ROS  negative psych ROS   GI/Hepatic negative GI ROS, Neg liver ROS,   Endo/Other  negative endocrine ROS  Renal/GU negative Renal ROS     Musculoskeletal negative musculoskeletal ROS (+)   Abdominal   Peds  Hematology negative hematology ROS (+)   Anesthesia Other Findings   Reproductive/Obstetrics negative OB ROS                             Anesthesia Physical Anesthesia Plan  ASA: II  Anesthesia Plan: General   Post-op Pain Management:    Induction: Intravenous  Airway Management Planned: Oral ETT  Additional Equipment:   Intra-op Plan:   Post-operative Plan: Extubation in OR  Informed Consent: I have reviewed the patients History and Physical, chart, labs and discussed the procedure including the risks, benefits and alternatives for the proposed anesthesia with the patient or authorized representative who has indicated his/her understanding and acceptance.   Dental advisory given  Plan Discussed with: CRNA  Anesthesia Plan Comments:         Anesthesia Quick Evaluation  

## 2016-09-24 NOTE — H&P (View-Only) (Signed)
[name] is an 66 y.o. female.   Chief Complaint: back and L leg pain HPI: The patient is a 66 year old female who presents with back pain. The patient is here today in referral from Dr. Ethelene Hal. 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 Symptoms are reported to be located in the low back and Symptoms include pain, numbness, tingling and weakness (bilateral lower extremities). The pain radiates to the left posterior thigh and right posterior thigh. The patient describes the pain as sharp, aching and throbbing. The patient describes the severity of their symptoms as 9 / 10. Symptoms are exacerbated by standing. Current treatment includes nonsteroidal anti-inflammatory drugs (Ibuprofen) and activity modification. Prior to being seen today the patient was previously evaluated by Dr. Ethelene Hal. Past evaluation has included MRI of the lumbar spine. Past treatment has included nonsteroidal anti-inflammatory drugs, activity modification, physical therapy and chiropractic manipulation (years ago).  [name] is here for second opinion. Reports pain when she stands, better when she sits, mainly radiates on the left anterior thigh. She also has numbness and tingling in the bottom of the feet and the posterior aspect of the leg. She is here with her husband. She was seen at [facility]. She is seen by a doctor there. She wanted a second opinion and wanted more information. She has had an MRI. She has had x-rays. She has had formal supervised physical therapy, though that occurred prior to her x-rays or her MRI.  I reviewed her outside medical records, an extensive review. MRI done on 06/11/2016 indicating foraminal disc protrusion at L3-4 contacting the L3 root with severe stenosis.  She had a grade 1 listhesis at L3-4. Disc degeneration at L5-S1.  Osteoarthritis was noted in her right hip.  Dr. Sueanne Margarita discussed these findings with her and gave her options including a transforaminal injection, physical therapy, activity modification, and possible surgery.  She is here with her husband. She is not quite interested in epidural steroid injections. She does not have significant groin pain.  REVIEW OF SYSTEMS Review of systems is negative for fevers, chest pain, shortness of breath, unexplained recent weight loss, loss of bowel or bladder function, burning with urination, joint swelling, rashes, weakness or numbness, difficulty with balance, easy bruising, excessive thirst or frequent urination.  Family History Cerebrovascular Accident  Maternal Grandfather, Maternal Grandmother. Diabetes Mellitus  Mother. Hypertension  Father, Mother. Rheumatoid Arthritis  Mother. First Degree Relatives   Social History Tobacco use  Never smoker. 08/14/2016 Children  1 Current drinker  08/14/2016: Currently drinks wine only occasionally per week Current work status  working part time Exercise  Exercises weekly; does gym / [employer] Living situation  live alone Marital status  divorced No history of drug/alcohol rehab  Not under pain contract  Number of flights of stairs before winded  greater than 5 Tobacco / smoke exposure  08/14/2016: no  Medication History Ibuprofen (200MG  Capsule, Oral) Active.  Pregnancy / Birth History Pregnant  no  Past Surgical History Appendectomy  Hysterectomy  partial (non-cancerous)  Past Medical Hx No pertinent past medical hx  Allergies: NKDA  Review of Systems  Constitutional: Negative.   HENT: Negative.   Eyes: Negative.   Respiratory: Negative.   Cardiovascular: Negative.   Gastrointestinal: Negative.   Genitourinary: Negative.   Musculoskeletal: Positive for back pain.  Skin: Negative.  Neurological: Positive for sensory change and focal weakness.   Psychiatric/Behavioral: Negative.     There were no vitals taken for this visit. Physical Exam  Constitutional: She is oriented to person, place, and time. She appears well-developed.  HENT:  Head: Normocephalic.  Eyes: Pupils are equal, round, and reactive to light.  Neck: Normal range of motion.  Cardiovascular: Normal rate.   Respiratory: Effort normal.  GI: Soft.  Musculoskeletal:  Healthy, in no distress. Mood and affect is appropriate. Walks with slightly antalgic gait, forward flexed. Straight leg raise is actually negative bilaterally. She has some slight hip flexor weakness on the left compared to the right.  She has pain with extension and relieved with forward flexion of the lumbar spine.  Lumbar spine exam reveals no evidence of soft tissue swelling, deformity or skin ecchymosis. On palpation there is no tenderness of the lumbar spine. No flank pain with percussion. The abdomen is soft and nontender. Nontender over the trochanters. No cellulitis or lymphadenopathy.  Good range of motion of the lumbar spine without associated pain. Motor is 5/5 including EHL, tibialis anterior, plantar flexion, quadriceps and hamstrings. Patient is normoreflexic. There is no Babinski or clonus. Sensory exam is intact to light touch. Patient has good distal pulses. No DVT. No pain and normal range of motion without instability of the knees and ankles.  Painless range of motion of the cervical spine.  Neurological: She is alert and oriented to person, place, and time. She has normal reflexes.  Skin: Skin is warm and dry.    AP, lateral, flexion and extension radiographs of the lumbar spine demonstrate a mild thoracolumbar scoliosis, grade 1 listhesis at L3-4, minimal instability in flexion and extension. End-stage disc degeneration at L5-S, moderate at L3-4.  I reviewed her outside MRI from [facility]. Severe multifactorial stenosis at L3-4. Neural foraminal stenosis with disc  protrusion at L3-4.  Assessment/Plan 1. 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 2. Spondylolisthesis at L3-4. 3. Disc protrusion at L3-4, left. 4. 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 5. Osteoarthrosis of the right hip.  We had extensive discussion concerning current pathology, relevant anatomy, and treatment options reviewing her treatment to date, her x-rays, and MRI. Forty five minutes dedicated to this full discussion and in presentation. We discussed Williams flexion program in terms of physical therapy and at length activity modification to avoid extension and precipitation of her claudication by favoring forward flexion.  Additionally, we did discuss analgesics as well as possible epidural versus selective nerve root blocks at L3 and L4. They are not interested in epidural steroid injections. In terms of surgical intervention, I feel that predominant of her symptoms are secondary to the severe stenosis at L3-4, L4-5, and L5-S1 that do not appear to be the main pain generator. Her main problem is neurogenic claudication in terms of the left lower extremity radicular pain, dysesthesia in the plantar aspect of the feet that basically resolves when she sits and presents when she stands. I would feel any surgical invention would required decompression at L3-4, minimum lateral mass fusion, and possible instrumentation. She is otherwise pretty healthy and low comorbidities. I have spent considerable time in discussions. She will contemplate her options. We will call her in a week. I would like to determine whether she would be a candidate for an XLIF. I will discuss this with Dr. Shon Baton and then will call them back and discuss further options. We discussed surgical procedure, time in the hospital, and  the postoperative course.  Dorothy SparkBISSELL, Marya Lowden M., PA-C for Dr. Shelle IronBeane 09/04/2016, 9:20 AM

## 2016-09-24 NOTE — Brief Op Note (Signed)
09/24/2016  10:59 AM  PATIENT:  [name]  66 y.o. female  PRE-OPERATIVE DIAGNOSIS:  Spinal Stenosis, spondylolisthesis L3-4  POST-OPERATIVE DIAGNOSIS:  Spinal Stenosis, spondylolisthesis L3-4  PROCEDURE:  Procedure(s) with comments: Microlumbar decompression L3-4, lateral mass fusion using autograft bone graft, and allograft bone graft (Bilateral) - Requests 2.5 hours  SURGEON:  Surgeon(s) and Role:    * Jene EveryJeffrey Cahlil Sattar, MD - Primary  PHYSICIAN ASSISTANT:   ASSISTANTS: Bissell   ANESTHESIA:   general  EBL:  Total I/O In: 1000 [I.V.:1000] Out: 250 [Urine:100; Blood:150]  BLOOD ADMINISTERED:none  DRAINS: none   LOCAL MEDICATIONS USED:  MARCAINE     SPECIMEN:  No Specimen  DISPOSITION OF SPECIMEN:  N/A  COUNTS:  YES  TOURNIQUET:  * No tourniquets in log *  DICTATION: .Other Dictation: Dictation Number [id number]  PLAN OF CARE: Admit for overnight observation  PATIENT DISPOSITION:  PACU - hemodynamically stable.   Delay start of Pharmacological VTE agent (>24hrs) due to surgical blood loss or risk of bleeding: yes

## 2016-09-24 NOTE — Anesthesia Postprocedure Evaluation (Signed)
Anesthesia Post Note  Patient: [name]  Procedure(s) Performed: Procedure(s) (LRB): Microlumbar decompression L3-4, lateral mass fusion using autograft bone graft, and allograft bone graft (Bilateral)  Patient location during evaluation: PACU Anesthesia Type: General Level of consciousness: sedated and patient cooperative Pain management: pain level controlled Vital Signs Assessment: post-procedure vital signs reviewed and stable Respiratory status: spontaneous breathing Cardiovascular status: stable Anesthetic complications: no       Last Vitals:  Vitals:   09/24/16 1245 09/24/16 1315  BP: 103/71 100/64  Pulse: 85 77  Resp: 13 12  Temp: 36.5 C 36.8 C    Last Pain:  Vitals:   09/24/16 1315  TempSrc:   PainSc: Asleep                 Lewie LoronJohn Samier Jaco

## 2016-09-24 NOTE — Discharge Instructions (Signed)

## 2016-09-24 NOTE — Transfer of Care (Signed)
Immediate Anesthesia Transfer of Care Note  Patient: Tamara EvensJudy Nipp  Procedure(s) Performed: Procedure(s) with comments: Microlumbar decompression L3-4, lateral mass fusion using autograft bone graft, and allograft bone graft (Bilateral) - Requests 2.5 hours  Patient Location: PACU  Anesthesia Type:General  Level of Consciousness: awake  Airway & Oxygen Therapy: Patient Spontanous Breathing and Patient connected to face mask oxygen  Post-op Assessment: Report given to RN and Post -op Vital signs reviewed and stable  Post vital signs: Reviewed and stable  Last Vitals:  Vitals:   09/24/16 0630  BP: 115/87  Pulse: 75  Resp: 16  Temp: 36.6 C    Last Pain:  Vitals:   09/24/16 0716  TempSrc:   PainSc: 6       Patients Stated Pain Goal: 5 (09/24/16 0716)  Complications: No apparent anesthesia complications

## 2016-09-24 NOTE — Interval H&P Note (Signed)
History and Physical Interval Note:  09/24/2016 8:27 AM  Randa EvensJudy Thelin  has presented today for surgery, with the diagnosis of Spinal Stenosis, spondylolisthesis L3-4  The various methods of treatment have been discussed with the patient and family. After consideration of risks, benefits and other options for treatment, the patient has consented to  Procedure(s) with comments: Microlumbar decompression L3-4, lateral mass fusion using autograft bone graft, possible allograft bone graft (N/A) - Requests 2.5 hours as a surgical intervention .  The patient's history has been reviewed, patient examined, no change in status, stable for surgery.  I have reviewed the patient's chart and labs.  Questions were answered to the patient's satisfaction.     Estelita Iten C

## 2016-09-25 DIAGNOSIS — Z791 Long term (current) use of non-steroidal anti-inflammatories (NSAID): Secondary | ICD-10-CM | POA: Diagnosis not present

## 2016-09-25 DIAGNOSIS — M48062 Spinal stenosis, lumbar region with neurogenic claudication: Secondary | ICD-10-CM | POA: Diagnosis not present

## 2016-09-25 DIAGNOSIS — M4316 Spondylolisthesis, lumbar region: Secondary | ICD-10-CM | POA: Diagnosis not present

## 2016-09-25 LAB — CBC
HCT: 34.5 % — ABNORMAL LOW (ref 36.0–46.0)
Hemoglobin: 11.1 g/dL — ABNORMAL LOW (ref 12.0–15.0)
MCH: 29.3 pg (ref 26.0–34.0)
MCHC: 32.2 g/dL (ref 30.0–36.0)
MCV: 91 fL (ref 78.0–100.0)
PLATELETS: 194 10*3/uL (ref 150–400)
RBC: 3.79 MIL/uL — ABNORMAL LOW (ref 3.87–5.11)
RDW: 13.6 % (ref 11.5–15.5)
WBC: 11.8 10*3/uL — ABNORMAL HIGH (ref 4.0–10.5)

## 2016-09-25 LAB — BASIC METABOLIC PANEL
Anion gap: 5 (ref 5–15)
BUN: 10 mg/dL (ref 6–20)
CO2: 28 mmol/L (ref 22–32)
CREATININE: 0.82 mg/dL (ref 0.44–1.00)
Calcium: 8.8 mg/dL — ABNORMAL LOW (ref 8.9–10.3)
Chloride: 109 mmol/L (ref 101–111)
GFR calc Af Amer: >60 mL/min (ref >60)
GLUCOSE: 118 mg/dL — AB (ref 65–99)
Potassium: 4.2 mmol/L (ref 3.5–5.1)
SODIUM: 142 mmol/L (ref 135–145)

## 2016-09-25 LAB — TYPE AND SCREEN
ABO/RH(D): O POS
ANTIBODY SCREEN: NEGATIVE

## 2016-09-25 MED ORDER — SODIUM CHLORIDE 0.9 % IV BOLUS (SEPSIS)
500.0000 mL | Freq: Once | INTRAVENOUS | Status: AC
  Administered 2016-09-25: 500 mL via INTRAVENOUS

## 2016-09-25 MED ORDER — KCL IN DEXTROSE-NACL 20-5-0.45 MEQ/L-%-% IV SOLN: INTRAVENOUS | Status: AC

## 2016-09-25 MED ORDER — IBUPROFEN 200 MG PO TABS: 400.0000 mg | ORAL_TABLET | Freq: Three times a day (TID) | ORAL | 0 refills | Status: AC

## 2016-09-25 NOTE — Progress Notes (Signed)
Paged Irish EldersJackie Bissell, PA, to make her aware of the hypotensive episode that occurred with therapy this afternoon. The patient reported feeling "funny". Patient was assisted to the chair and BP was 70s/40s. After sitting up for about 15 minutes, BP increased to about 94/66. New orders for an EKG given.

## 2016-09-25 NOTE — Op Note (Signed)
NAMKathreen [name]:  [name], [name]               ACCOUNT NO.:  [id number]  MEDICAL RECORD NO.:  [id number]  LOCATION:                                 FACILITY:  PHYSICIAN:  Jene EveryJeffrey Amee Boothe, M.D.         DATE OF BIRTH:  DATE OF PROCEDURE:  09/24/2016 DATE OF DISCHARGE:                              OPERATIVE REPORT   PREOPERATIVE DIAGNOSIS:  Spinal stenosis and spinal lithiasis at L3-4.  POSTOPERATIVE DIAGNOSIS:  Spinal stenosis and spinal lithiasis at L3-4.  PROCEDURES PERFORMED: 1. Microlumbar decompression, L3-4; bilateral L3 and L4     foraminotomies. 2. Lateral mass fusion, L3-4 utilizing autologous and allograft bone     graft.  ANESTHESIA:  General.  ASSISTANT:  Tamara PocheJacqueline Bissell, PA.  HISTORY:  A 66 year old female with neurogenic claudication secondary to spinal stenosis, mainly leg weakness as well as back pain.  She has underlying scoliosis, osteopenia, and a grade 1 slip at L3-4 without instability on flexion, extension, mainly a rotational issue.  We discussed decompression at 3-4 and lateral mass fusion with autologous and allograft bone graft.  Did not want instrumentation, felt given underlying soft bone, minimal slip, no instability on flexion and extension, and no back pain, it will be appropriate to perform lateral mass without instrumentation.  Risks and benefits were discussed including bleeding, infection, damage to the neurovascular structures, no change in symptoms, worsening symptoms, need for revision or instrumentation in the future, etc.  TECHNIQUE:  With the patient in supine position, after induction of adequate general anesthesia, 2 g of Kefzol, placed prone on the RondaAndrews frame.  All bony prominences were well padded.  Lumbar region was prepped and draped in usual sterile fashion.  Foley to gravity.  Two 18- gauge spinal needles were utilized to localize the 3-4 interspace, confirmed with x-ray.  An incision was made from above the spinous process of 3 to  just below 4.  Subcutaneous tissue was dissected. Electrocautery was utilized to achieve hemostasis.  Dorsolumbar fascia was divided in line with skin incision.  Paraspinous muscles were infiltrated with 0.25% Marcaine with epinephrine.  Bipolar electrocautery was utilized to achieve hemostasis.  The McCullough retractor was placed, operating microscope was draped and brought on the surgical field.  Confirmatory radiograph obtained.  We removed the spinous process of 3 and partially of 4 after was skeletonized.  We morselized the bone and saved it for bone graft.  This required augmentation with a DePuy cancellous bone graft chips.  This placed in a separate receptacle and soaked in autologous blood.  We turned our attention back to the decompression.  She had a very small interlaminar window.  We used a 2-mm Kerrison and then performed hemilaminotomies of the caudad edge of 3 bilaterally and centrally up to, to detach the ligamentum flavum.  I then utilized a straight microcurette to detach the ligamentum flavum from the cephalad edge of 4.  We then removed ligamentum flavum from the interspace.  There was severe central and lateral recess stenosis noted.  Meticulously decompressed the lateral recesses to the medial border of the pedicle utilizing the 2-mm Kerrison.  We performed foraminotomies of L3 and L4.  Bipolar electrocautery  was utilized to achieve hemostasis as well as thrombin- soaked Gelfoam and bone wax.  There was no evidence of disk herniation noted on either side, and after generous foraminotomies with excellent restoration of the thecal sac, no evidence of CSF leakage.  A neural probe passed freely at the foramen of 3 and 4, above the pedicle of 3, below the pedicle of 4.  Less than 25% of the facets were excised bilaterally.  We undercut the facets bilaterally to decompress the lateral recesses again.  I obtained a confirmatory radiograph with a probe in the foramen of 3  and of 5. Then, localizing the TPs of 3 and 5 over the facet of 3 and 4 then after confirmatory radiograph obtained and we irrigated the wound copiously and covered the thecal sac with Gelfoam.  I incised just over the facets bilaterally preserving the capsules and digitally formed a pocket for the bone graft.  I curetted the outer aspect of the facet, the pars. Down to the TPs, which palpated bilaterally.  I then used the autologous, morselized bone combined with the cancellous chips and soaked in autologous blood and we used a spoon and packed over the lateral aspect of the facet into the lateral gutters bilaterally, and impacted that digitally.  Next, we copiously irrigated the wound once again.  Inspection revealed no evidence of CSF leakage or active bleeding.  Placed thrombin-soaked Gelfoam in the laminotomy defect and out laterally.  Removed the [facility] retractor.  Meticulously inspected the paraspinous musculature and no active bleeding was noted.  We closed the dorsolumbar fascia with #1 Vicryl interrupted figure-of-eight suture, the small area of the cephalad edge of the fascia that was left open about 3-4 mm in length.  I then closed the subcu with 2-0 and skin with staples.  Wound was dressed sterilely.  We felt, no need for a drain at this point.  I felt that we had an excellent decompression and addressed her pathology.  I felt retaining the neural arch was appropriate for stability of the spine at this level.  When we reapproximated the dorsolumbar fascia, we reapproximated the dorsolumbar fascia together in the midline.  She was then placed on the hospital bed, extubated without difficulty, and transported to the recovery room in satisfactory condition.  The patient tolerated the procedure well.  No complications.  Assistant, [name], [place].  Minimal blood loss, 150 mL.     Jene Every, M.D.   ______________________________ Jene Every,  M.D.    Cordelia Pen  D:  09/24/2016  T:  09/25/2016  Job:  [id number]

## 2016-09-25 NOTE — Care Management Obs Status (Signed)
MEDICARE OBSERVATION STATUS NOTIFICATION   Patient Details  Name: [name] MRN: [id number] Date of Birth: 08/10/1950   Medicare Observation Status Notification Given:  Yes    Yves DillJeffries, [place] Hijazi Christine, RN 09/25/2016, 12:08 PM

## 2016-09-25 NOTE — Progress Notes (Signed)
Subjective: 1 Day Post-Op Procedure(s) (LRB): Microlumbar decompression L3-4, lateral mass fusion using autograft bone graft, and allograft bone graft (Bilateral) Patient reports pain as mild.  Reports nausea when sitting up in bed. No HA. Leg pain improved. Concerned about her low BP and nausea this AM.  Objective: Vital signs in last 24 hours: Temp:  [97.5 F (36.4 C)-99.1 F (37.3 C)] 98.3 F (36.8 C) (03/08 0952) Pulse Rate:  [74-92] 87 (03/08 0952) Resp:  [11-20] 14 (03/08 0952) BP: (91-121)/(52-82) 93/52 (03/08 0952) SpO2:  [98 %-100 %] 98 % (03/08 0952)  Intake/Output from previous day: 03/07 0701 - 03/08 0700 In: 3280 [P.O.:660; I.V.:2370; IV Piggyback:250] Out: 1975 [Urine:1825; Blood:150] Intake/Output this shift: Total I/O In: 240 [P.O.:240] Out: 300 [Urine:300]   Recent Labs  09/25/16 0454  HGB 11.1*    Recent Labs  09/25/16 0454  WBC 11.8*  RBC 3.79*  HCT 34.5*  PLT 194    Recent Labs  09/25/16 0454  NA 142  K 4.2  CL 109  CO2 28  BUN 10  CREATININE 0.82  GLUCOSE 118*  CALCIUM 8.8*   No results for input(s): LABPT, INR in the last 72 hours.  Neurologically intact ABD soft Neurovascular intact Sensation intact distally Intact pulses distally Dorsiflexion/Plantar flexion intact Incision: dressing C/D/I and no drainage No cellulitis present Compartment soft no calf pain or sign of DVT  Assessment/Plan: 1 Day Post-Op Procedure(s) (LRB): Microlumbar decompression L3-4, lateral mass fusion using autograft bone graft, and allograft bone graft (Bilateral) Advance diet Up with therapy as tolerated Continue IVF at 50cc /hr as long as she continues with good PO water intake Hold off on narcotics Anti-emetics prn Slowly advance angle of bed to allow her to adjust to sitting up Anticipate she will stay today and plan for D/C tomorrow as long as improving Seen by myself and Dr. Elissa [name] [name], Tamara BarkerJACLYN M. 09/25/2016, 10:15 AM

## 2016-09-25 NOTE — Evaluation (Signed)
Physical Therapy Evaluation Patient Details Name: [name] MRN: [id number] DOB: [birth year] Today's Date: 09/25/2016   History of Present Illness  Pt s/p L 3-4 microlumbar decompression with lateral mass fusion  Clinical Impression  Pt s/p back surgery and presents with post op pain, dizziness and nausea limiting functional mobility.  Pt should progress to dc home with assist of friend.    Follow Up Recommendations No PT follow up    Equipment Recommendations  Rolling walker with 5" wheels    Recommendations for Other Services OT consult     Precautions / Restrictions Precautions Precautions: Fall;Back Precaution Booklet Issued: Yes (comment) Precaution Comments: Back precautions reviewed x2 Required Braces or Orthoses: Spinal Brace Spinal Brace: Applied in sitting position Restrictions Weight Bearing Restrictions: No      Mobility  Bed Mobility Overal bed mobility: Needs Assistance Bed Mobility: Supine to Sit;Sit to Supine     Supine to sit: Min assist;Mod assist Sit to supine: Min assist;Mod assist   General bed mobility comments: cues for log roll technique and adherence to back precautions  Transfers                 General transfer comment: NT - pt nauseous and with increasing dizziness in sitting.  BP 84/51 and pt returned to supine  Ambulation/Gait                Stairs            Wheelchair Mobility    Modified Rankin (Stroke Patients Only)       Balance                                             Pertinent Vitals/Pain Pain Assessment: 0-10 Pain Score: 9  Pain Location: back Pain Descriptors / Indicators: Aching;Sore Pain Intervention(s): Limited activity within patient's tolerance;Monitored during session;Premedicated before session;Patient requesting pain meds-RN notified    Home Living Family/patient expects to be discharged to:: Private residence Living Arrangements: Alone Available Help at  Discharge: Friend(s) Type of Home: House Home Access: Stairs to enter   Secretary/administratorntrance Stairs-Number of Steps: 1 Home Layout: One level Home Equipment: None Additional Comments: Pt plans to stay with friend in [place] initially    Prior Function Level of Independence: Independent               Hand Dominance        Extremity/Trunk Assessment   Upper Extremity Assessment Upper Extremity Assessment: Overall WFL for tasks assessed    Lower Extremity Assessment Lower Extremity Assessment: Overall WFL for tasks assessed       Communication   Communication: No difficulties  Cognition Arousal/Alertness: Awake/alert Behavior During Therapy: WFL for tasks assessed/performed Overall Cognitive Status: Within Functional Limits for tasks assessed                      General Comments      Exercises     Assessment/Plan    PT Assessment Patient needs continued PT services  PT Problem List Decreased strength;Decreased range of motion;Decreased activity tolerance;Pain;Decreased mobility;Decreased knowledge of use of DME;Decreased safety awareness       PT Treatment Interventions DME instruction;Gait training;Stair training;Functional mobility training;Therapeutic activities;Therapeutic exercise;Patient/family education    PT Goals (Current goals can be found in the Care Plan section)  Acute Rehab PT Goals Patient Stated Goal: Less pain,  regain IND PT Goal Formulation: With patient Time For Goal Achievement: 09/30/16 Potential to Achieve Goals: Good    Frequency 7X/week   Barriers to discharge        Co-evaluation               End of Session Equipment Utilized During Treatment: Back brace Activity Tolerance: Patient limited by pain;Patient limited by fatigue;Other (comment) (nausea) Patient left: in bed;with call bell/phone within reach Nurse Communication: Mobility status;Patient requests pain meds;Other (comment) (nausea) PT Visit Diagnosis:  Difficulty in walking, not elsewhere classified (R26.2)    Functional Assessment Tool Used: Clinical judgement Functional Limitation: Mobility: Walking and moving around Mobility: Walking and Moving Around Current Status (V4098): At least 40 percent but less than 60 percent impaired, limited or restricted Mobility: Walking and Moving Around Goal Status [phone]): At least 1 percent but less than 20 percent impaired, limited or restricted    Time: 0900-0928 PT Time Calculation (min) (ACUTE ONLY): 28 min   Charges:   PT Evaluation $PT Eval Low Complexity: 1 Procedure PT Treatments $Therapeutic Activity: 8-22 mins   PT G Codes:   PT G-Codes **NOT FOR INPATIENT CLASS** Functional Assessment Tool Used: Clinical judgement Functional Limitation: Mobility: Walking and moving around Mobility: Walking and Moving Around Current Status (N8295): At least 40 percent but less than 60 percent impaired, limited or restricted Mobility: Walking and Moving Around Goal Status [phone]): At least 1 percent but less than 20 percent impaired, limited or restricted     [name] 09/25/2016, 12:17 PM

## 2016-09-25 NOTE — Progress Notes (Signed)
Physical Therapy Treatment Patient Details Name: [name] MRN: [id number] DOB: 1950-11-14 Today's Date: 09/25/2016    History of Present Illness Pt s/p L 3-4 microlumbar decompression with lateral mass fusion    PT Comments    Marked improvement in activity tolerance with no c/o dizziness/nausea with mobility.  Follow Up Recommendations  No PT follow up     Equipment Recommendations  Rolling walker with 5" wheels    Recommendations for Other Services OT consult     Precautions / Restrictions Precautions Precautions: Fall;Back Precaution Booklet Issued: Yes (comment) Precaution Comments: Back precautions reviewed x2 Required Braces or Orthoses: Spinal Brace Spinal Brace: Applied in sitting position Restrictions Weight Bearing Restrictions: No    Mobility  Bed Mobility Overal bed mobility: Needs Assistance Bed Mobility: Supine to Sit     Supine to sit: Min assist Sit to supine: Min assist   General bed mobility comments: Increased time and cues for correct log roll technique  Transfers Overall transfer level: Needs assistance Equipment used: Rolling walker (2 wheeled) Transfers: Sit to/from Stand Sit to Stand: Min assist Stand pivot transfers: Min assist       General transfer comment: cues for transition position, use of UEs to self assist and adherence to back precautions  Ambulation/Gait Ambulation/Gait assistance: Min assist Ambulation Distance (Feet): 250 Feet Assistive device: Rolling walker (2 wheeled) Gait Pattern/deviations: Step-to pattern;Decreased step length - right;Decreased step length - left;Shuffle;Trunk flexed Gait velocity: decr Gait velocity interpretation: Below normal speed for age/gender General Gait Details: cues for posture and position from [employer]            Wheelchair Mobility    Modified Rankin (Stroke Patients Only)       Balance                                    Cognition  Arousal/Alertness: Awake/alert Behavior During Therapy: WFL for tasks assessed/performed Overall Cognitive Status: Within Functional Limits for tasks assessed                      Exercises      General Comments        Pertinent Vitals/Pain Pain Assessment: 0-10 Pain Score: 7  Pain Location: back Pain Descriptors / Indicators: Aching;Sore Pain Intervention(s): Limited activity within patient's tolerance;Monitored during session;Premedicated before session    Home Living Family/patient expects to be discharged to:: Private residence Living Arrangements: Alone Available Help at Discharge: Friend(s) Type of Home: House Home Access: Stairs to enter   Home Layout: One level Home Equipment: None Additional Comments: Pt plans to stay with friend in [place] initially    Prior Function Level of Independence: Independent          PT Goals (current goals can now be found in the care plan section) Acute Rehab PT Goals Patient Stated Goal: Less pain, regain IND PT Goal Formulation: With patient Time For Goal Achievement: 09/30/16 Potential to Achieve Goals: Good Progress towards PT goals: Progressing toward goals    Frequency    7X/week      PT Plan Current plan remains appropriate    Co-evaluation             End of Session Equipment Utilized During Treatment: Back brace Activity Tolerance: Patient tolerated treatment well;Patient limited by pain Patient left: Other (comment) (in bathroom with OT) Nurse Communication: Mobility status PT Visit Diagnosis: Difficulty  in walking, not elsewhere classified (R26.2)     Time: 1410-1433 PT Time Calculation (min) (ACUTE ONLY): 23 min  Charges:  $Gait Training: 8-22 mins                    G Codes:       Skya Mccullum 09/25/2016, 5:10 PM

## 2016-09-25 NOTE — Evaluation (Signed)
Occupational Therapy Evaluation Patient Details Name: Tamara EvensJudy Yanke MRN: [id number] DOB: [birth year] Today's Date: 09/25/2016    History of Present Illness Pt s/p L 3-4 microlumbar decompression with lateral mass fusion   Clinical Impression  Patient is s/p back  surgery resulting in the deficits listed below (see OT Problem List). Patient will benefit from skilled OT to increase their safety and independence with ADL and functional mobility for ADL (while adhering to their precautions) to facilitate discharge to venue listed below.      Follow Up Recommendations  No OT follow up    Equipment Recommendations  3 in 1 bedside commode    Recommendations for Other Services       Precautions / Restrictions Precautions Precautions: Fall;Back Precaution Booklet Issued: Yes (comment) Precaution Comments: Back precautions reviewed x2 Required Braces or Orthoses: Spinal Brace Spinal Brace: Applied in sitting position Restrictions Weight Bearing Restrictions: No      Mobility Bed Mobility           Sit to supine: Min assist   General bed mobility comments: performed with PT  Transfers Overall transfer level: Needs assistance Equipment used: Rolling walker (2 wheeled) Transfers: Sit to/from [employer]Stand;Stand Pivot Transfers Sit to Stand: Min assist Stand pivot transfers: Min assist       General transfer comment: VC for back preutions    Balance                                            ADL Overall ADL's : Needs assistance/impaired     Grooming: Standing;Wash/dry face;Wash/dry hands;Minimal assistance               Lower Body Dressing: Maximal assistance;Sit to/from stand;Cueing for safety;Cueing for sequencing Lower Body Dressing Details (indicate cue type and reason): will need AE Toilet Transfer: Minimal assistance;RW;Ambulation;Cueing for sequencing;Cueing for safety;Grab bars   Toileting- Clothing Manipulation and Hygiene: Minimal  assistance;Sit to/from stand;Cueing for back precautions;Cueing for safety;Cueing for sequencing;Adhering to back precautions         General ADL Comments: pt began feeling sick after going to the bathroom. Pts BP 74/58. RN aware.  Legs elevated and head reclined . BP up to 88/64     Vision Patient Visual Report: No change from baseline       Perception     Praxis      Pertinent Vitals/Pain Pain Score: 9  Pain Location: back Pain Descriptors / Indicators: Aching;Sore Pain Intervention(s): Repositioned     Hand Dominance        Communication Communication Communication: No difficulties   Cognition Arousal/Alertness: Awake/alert Behavior During Therapy: WFL for tasks assessed/performed Overall Cognitive Status: Within Functional Limits for tasks assessed                                Home Living Family/patient expects to be discharged to:: Private residence Living Arrangements: Alone Available Help at Discharge: Friend(s) Type of Home: House Home Access: Stairs to enter [employer] CorporationEntrance Stairs-Number of Steps: 1   Home Layout: One level     Bathroom Shower/Tub: Tub/shower unit;Walk-in shower   Bathroom Toilet: Handicapped height     Home Equipment: None   Additional Comments: Pt plans to stay with friend in [place] initially      Prior Functioning/Environment Level of Independence: Independent  OT Problem List: Decreased strength;Decreased knowledge of use of DME or AE;Decreased activity tolerance;Decreased safety awareness;Pain      OT Treatment/Interventions: Self-care/ADL training;DME and/or AE instruction;Patient/family education    OT Goals(Current goals can be found in the care plan section) Acute Rehab OT Goals Patient Stated Goal: Less pain, regain IND OT Goal Formulation: With patient Time For Goal Achievement: 10/09/16  OT Frequency: Min 2X/week   Barriers to D/C:               End of Session Equipment  Utilized During Treatment: Rolling walker  Activity Tolerance: Patient tolerated treatment well;Treatment limited secondary to medical complications (Comment) (low BP) Patient left: in chair;with call bell/phone within reach;with family/visitor present  OT Visit Diagnosis: Pain;Muscle weakness (generalized) (M62.81)                ADL either performed or assessed with clinical judgement  Time: 1430-1454 OT Time Calculation (min): 24 min Charges:  OT General Charges $OT Visit: 1 Procedure OT Evaluation $OT Eval Moderate Complexity: 1 Procedure G-Codes: OT G-codes **NOT FOR INPATIENT CLASS** Functional Assessment Tool Used: Clinical judgement Functional Limitation: Self care Self Care Current Status (Z6109): At least 40 percent but less than 60 percent impaired, limited or restricted Self Care Goal Status (U0454): At least 1 percent but less than 20 percent impaired, limited or restricted   Lise Auer, [place] [phone]  Einar Crow D 09/25/2016, 3:02 PM

## 2016-09-26 DIAGNOSIS — M48062 Spinal stenosis, lumbar region with neurogenic claudication: Secondary | ICD-10-CM | POA: Diagnosis not present

## 2016-09-26 LAB — CBC
HCT: 34.1 % — ABNORMAL LOW (ref 36.0–46.0)
Hemoglobin: 11.1 g/dL — ABNORMAL LOW (ref 12.0–15.0)
MCH: 30.7 pg (ref 26.0–34.0)
MCHC: 32.6 g/dL (ref 30.0–36.0)
MCV: 94.2 fL (ref 78.0–100.0)
PLATELETS: 182 10*3/uL (ref 150–400)
RBC: 3.62 MIL/uL — ABNORMAL LOW (ref 3.87–5.11)
RDW: 14 % (ref 11.5–15.5)
WBC: 13.4 10*3/uL — ABNORMAL HIGH (ref 4.0–10.5)

## 2016-09-26 NOTE — Progress Notes (Signed)
Occupational Therapy Treatment Patient Details Name: [name] MRN: [id number] DOB: 1951/06/01 Today's Date: 09/26/2016    History of present illness Pt s/p L 3-4 microlumbar decompression with lateral mass fusion   OT comments  Reviewed all education this session, ambulated to bathroom and repositioned in bed at end of session.    Follow Up Recommendations  No OT follow up;Supervision/Assistance - 24 hour    Equipment Recommendations  3 in 1 bedside commode    Recommendations for Other Services      Precautions / Restrictions Precautions Precautions: Fall;Back Precaution Booklet Issued: Yes (comment) Precaution Comments: Back precautions reviewed  Required Braces or Orthoses: Spinal Brace Spinal Brace: Applied in sitting position Restrictions Weight Bearing Restrictions: No       Mobility Bed Mobility Overal bed mobility: Needs Assistance Bed Mobility: Supine to Sit     Sit to supine: Min assist     General bed mobility comments: Increased time and cues for correct log roll technique. Used chux pad to assist with rolling (min A)  Transfers Overall transfer level: Needs assistance Equipment used: Rolling walker (2 wheeled) Transfers: Sit to/from Stand Sit to Stand: Min assist         General transfer comment: Increased time and cues for transition position, use of UEs to self assist and adherence to back precautions.  Steadying assistance    Balance                                   ADL       Grooming: Wash/dry hands;Min guard;Standing               Lower Body Dressing: Moderate assistance;Sit to/from stand   Toilet Transfer: Minimal assistance;RW;Ambulation;Cueing for sequencing;Cueing for safety;Grab bars   Toileting- Clothing Manipulation and Hygiene: Minimal assistance;Sit to/from stand;Cueing for back precautions;Cueing for safety;Cueing for sequencing;Adhering to back precautions         General ADL Comments: reviewed  all back education and issued handout.  Pt is able to cross legs partially without pain and pulling to don underwear.  Educated on reacher to make this easier.  Also tried sock aide.  She has a long sponge at home.  Friend is available to assist as needed. Reviewed brace.  Returned to bed, reviewed positioning and left pt in sidelying at end of session      Vision                     Perception     Praxis      Cognition   Behavior During Therapy: [facility] for tasks assessed/performed Overall Cognitive Status: Within Functional Limits for tasks assessed                         Exercises     Shoulder Instructions       General Comments      Pertinent Vitals/ Pain       Pain Assessment: 0-10 Pain Score: 8  Pain Location: back Pain Descriptors / Indicators: Aching;Sore Pain Intervention(s): Limited activity within patient's tolerance;Monitored during session;Premedicated before session  Home Living                                          Prior Functioning/Environment  Frequency           Progress Toward Goals  OT Goals(current goals can now be found in the care plan section)  Progress towards OT goals: Progressing toward goals  Acute Rehab OT Goals Patient Stated Goal: Less pain, regain IND  Plan      Co-evaluation                 End of Session Equipment Utilized During Treatment: Rolling walker  OT Visit Diagnosis: Muscle weakness (generalized) (M62.81)   Activity Tolerance Patient tolerated treatment well;Treatment limited secondary to medical complications (Comment)   Patient Left in bed;with call bell/phone within reach;with bed alarm set   Nurse Communication          Time: [id number] OT Time Calculation (min): 37 min  Charges: OT General Charges $OT Visit: 1 Procedure OT Treatments $Self Care/Home Management : 23-37 mins  Marica OtterMaryellen Jeremia Groot,  OTR/L [phone] 09/26/2016   Deaven Barron 09/26/2016, 12:37 PM

## 2016-09-26 NOTE — Progress Notes (Signed)
Subjective: 2 Days Post-Op Procedure(s) (LRB): Microlumbar decompression L3-4, lateral mass fusion using autograft bone graft, and allograft bone graft (Bilateral) Patient reports pain as moderate.  Reports incisional back pain. Holding narcotics due to nausea and dizziness yesterday. Feels some better this morning but has not been OOB yet. She is sitting up at more of an angle than yesterday and seems to be tolerating that well. Leg pain improved.   Objective: Vital signs in last 24 hours: Temp:  [98.3 F (36.8 C)-100.1 F (37.8 C)] 99.6 F (37.6 C) (03/09 0527) Pulse Rate:  [87-106] 106 (03/09 0527) Resp:  [14-16] 16 (03/09 0527) BP: (74-116)/(52-68) 116/68 (03/09 0527) SpO2:  [95 %-98 %] 95 % (03/09 0527)  Intake/Output from previous day: 03/08 0701 - 03/09 0700 In: 1998.8 [P.O.:1080; I.V.:918.8] Out: 3600 [Urine:3600] Intake/Output this shift: Total I/O In: -  Out: 230 [Urine:230]   Recent Labs  09/25/16 0454  HGB 11.1*    Recent Labs  09/25/16 0454  WBC 11.8*  RBC 3.79*  HCT 34.5*  PLT 194    Recent Labs  09/25/16 0454  NA 142  K 4.2  CL 109  CO2 28  BUN 10  CREATININE 0.82  GLUCOSE 118*  CALCIUM 8.8*   No results for input(s): LABPT, INR in the last 72 hours.  Neurologically intact ABD soft Neurovascular intact Sensation intact distally Intact pulses distally Dorsiflexion/Plantar flexion intact Incision: dressing C/D/I and no drainage No cellulitis present Compartment soft no sign of DVT  Assessment/Plan: 2 Days Post-Op Procedure(s) (LRB): Microlumbar decompression L3-4, lateral mass fusion using autograft bone graft, and allograft bone graft (Bilateral) Advance diet Up with therapy D/C IV fluids  Minimize narcotics Continue IVF at this point until known how well she tolerates mobilizing today Recheck CBC this AM Possible d/c later today if tolerates PT well without hypotension and nausea Will discuss with Dr. Elissa Lovett, Dayna Barker. 09/26/2016, 8:09 AM

## 2016-09-26 NOTE — Progress Notes (Signed)
Physical Therapy Treatment Patient Details Name: [name] MRN: [id number] DOB: May 29, 1951 Today's Date: 09/26/2016    History of Present Illness Pt s/p L 3-4 microlumbar decompression with lateral mass fusion    PT Comments    Pt progressing steadily with mobility but continues limited by pain.   Follow Up Recommendations  No PT follow up     Equipment Recommendations  Rolling walker with 5" wheels    Recommendations for Other Services OT consult     Precautions / Restrictions Precautions Precautions: Fall;Back Precaution Booklet Issued: Yes (comment) Precaution Comments: Back precautions reviewed x2 Required Braces or Orthoses: Spinal Brace Spinal Brace: Applied in sitting position Restrictions Weight Bearing Restrictions: No    Mobility  Bed Mobility Overal bed mobility: Needs Assistance Bed Mobility: Supine to Sit     Supine to sit: Min assist     General bed mobility comments: Increased time and cues for correct log roll technique  Transfers Overall transfer level: Needs assistance Equipment used: Rolling walker (2 wheeled) Transfers: Sit to/from Stand Sit to Stand: Min assist         General transfer comment: Increaed time and cues for transition position, use of UEs to self assist and adherence to back precautions  Ambulation/Gait Ambulation/Gait assistance: Min assist;Min guard Ambulation Distance (Feet): 300 Feet Assistive device: Rolling walker (2 wheeled) Gait Pattern/deviations: Step-through pattern;Decreased step length - left;Decreased step length - right;Shuffle;Trunk flexed Gait velocity: decr Gait velocity interpretation: Below normal speed for age/gender General Gait Details: cues for posture and position from [employer]            Wheelchair Mobility    Modified Rankin (Stroke Patients Only)       Balance                                    Cognition Arousal/Alertness: Awake/alert Behavior During  Therapy: WFL for tasks assessed/performed Overall Cognitive Status: Within Functional Limits for tasks assessed                      Exercises      General Comments        Pertinent Vitals/Pain Pain Assessment: 0-10 Pain Score: 8  Pain Location: back Pain Descriptors / Indicators: Aching;Sore Pain Intervention(s): Limited activity within patient's tolerance;Monitored during session;Premedicated before session    Home Living                      Prior Function            PT Goals (current goals can now be found in the care plan section) Acute Rehab PT Goals Patient Stated Goal: Less pain, regain IND PT Goal Formulation: With patient Time For Goal Achievement: 09/30/16 Potential to Achieve Goals: Good Progress towards PT goals: Progressing toward goals    Frequency    7X/week      PT Plan Current plan remains appropriate    Co-evaluation             End of Session Equipment Utilized During Treatment: Back brace Activity Tolerance: Patient tolerated treatment well;Patient limited by pain;Patient limited by fatigue Patient left: in chair;with call bell/phone within reach Nurse Communication: Mobility status PT Visit Diagnosis: Difficulty in walking, not elsewhere classified (R26.2)     Time: 4098-1191 PT Time Calculation (min) (ACUTE ONLY): 26 min  Charges:  $Gait Training: 23-37 mins  G Codes:       Ramon Brant 09/26/2016, 12:15 PM

## 2016-09-26 NOTE — Progress Notes (Signed)
Physical Therapy Treatment Patient Details Name: [name] MRN: [id number] DOB: 1950-12-01 Today's Date: 09/26/2016    History of Present Illness Pt s/p L 3-4 microlumbar decompression with lateral mass fusion    PT Comments    Pt progressing well with mobility and hopeful for dc home tomorrow.   Follow Up Recommendations  No PT follow up     Equipment Recommendations  Rolling walker with 5" wheels    Recommendations for Other Services OT consult     Precautions / Restrictions Precautions Precautions: Fall;Back Precaution Comments: Back precautions reviewed x2 Required Braces or Orthoses: Spinal Brace Spinal Brace: Applied in sitting position Restrictions Weight Bearing Restrictions: No    Mobility  Bed Mobility Overal bed mobility: Needs Assistance Bed Mobility: Supine to Sit     Supine to sit: Min assist     General bed mobility comments: Increased time and cues for correct log roll technique  Transfers Overall transfer level: Needs assistance Equipment used: Rolling walker (2 wheeled) Transfers: Sit to/from Stand Sit to Stand: Min assist;Min guard         General transfer comment: Increased time and cues for transition position, use of UEs to self assist and adherence to back precautions  Ambulation/Gait Ambulation/Gait assistance: Min assist;Min guard Ambulation Distance (Feet): 300 Feet Assistive device: Rolling walker (2 wheeled) Gait Pattern/deviations: Step-through pattern;Decreased step length - left;Decreased step length - right;Shuffle;Trunk flexed Gait velocity: decr Gait velocity interpretation: Below normal speed for age/gender General Gait Details: cues for posture and position from [employer] Stairs: Yes   Stair Management: No rails;Step to pattern;Forwards;With walker Number of Stairs: 2 General stair comments: single step twice with RW and assist for balance and to move RW to next level  Wheelchair Mobility    Modified  Rankin (Stroke Patients Only)       Balance                                    Cognition Arousal/Alertness: Awake/alert Behavior During Therapy: WFL for tasks assessed/performed Overall Cognitive Status: Within Functional Limits for tasks assessed                      Exercises      General Comments        Pertinent Vitals/Pain Pain Assessment: 0-10 Pain Score: 7  Pain Location: back Pain Descriptors / Indicators: Aching;Sore Pain Intervention(s): Limited activity within patient's tolerance;Monitored during session;Premedicated before session    Home Living                      Prior Function            PT Goals (current goals can now be found in the care plan section) Acute Rehab PT Goals Patient Stated Goal: Less pain, regain IND PT Goal Formulation: With patient Time For Goal Achievement: 09/30/16 Potential to Achieve Goals: Good Progress towards PT goals: Progressing toward goals    Frequency    7X/week      PT Plan Current plan remains appropriate    Co-evaluation             End of Session Equipment Utilized During Treatment: Back brace Activity Tolerance: Patient tolerated treatment well Patient left: in chair;with call bell/phone within reach;with family/visitor present Nurse Communication: Mobility status PT Visit Diagnosis: Difficulty in walking, not elsewhere classified (R26.2)     Time:  [id number] PT Time Calculation (min) (ACUTE ONLY): 25 min  Charges:  $Gait Training: 23-37 mins                    G Codes:       [name] 09/26/2016, 5:04 PM

## 2016-09-27 DIAGNOSIS — M48062 Spinal stenosis, lumbar region with neurogenic claudication: Secondary | ICD-10-CM | POA: Diagnosis not present

## 2016-09-27 NOTE — Progress Notes (Signed)
Subjective: 3 Days Post-Op Procedure(s) (LRB): Microlumbar decompression L3-4, lateral mass fusion using autograft bone graft, and allograft bone graft (Bilateral)  Patient reports pain as mild to moderate.  Patient reports concerns over unsafe transfer and ambulatory status upon D/C.  States that she does not yet believe she is ready to leave.  Tolerating POs well.  Admits to BM.  Denies fever, chills, N/V.  Objective:   VITALS:  Temp:  [97.9 F (36.6 C)-100.1 F (37.8 C)] 100.1 F (37.8 C) (03/10 0520) Pulse Rate:  [93-98] 94 (03/10 0520) Resp:  [15-16] 15 (03/10 0520) BP: (100-132)/(58-75) 100/58 (03/10 0520) SpO2:  [96 %-98 %] 96 % (03/10 0520)  General: WDWN patient in NAD. Psych:  Appropriate mood and affect. Neuro:  A&O x 3, Moving all extremities, sensation intact to light touch HEENT:  EOMs intact Chest:  Even non-labored respirations Skin:  Dressing C/D/I, no rashes or lesions Extremities: warm/dry, mild edema, no erythema or echymosis.  No lymphadenopathy. Pulses: Femoral 2+ MSK:  ROM: slow deliberate movement with turning on to R side for inspection of dressing.  Full HF, TKE, MMT: patient is able to perform quad set, (-) Homan's    LABS  Recent Labs  09/25/16 0454 09/26/16 0802  HGB 11.1* 11.1*  WBC 11.8* 13.4*  PLT 194 182    Recent Labs  09/25/16 0454  NA 142  K 4.2  CL 109  CO2 28  BUN 10  CREATININE 0.82  GLUCOSE 118*   No results for input(s): LABPT, INR in the last 72 hours.   Assessment/Plan: 3 Days Post-Op Procedure(s) (LRB): Microlumbar decompression L3-4, lateral mass fusion using autograft bone graft, and allograft bone graft (Bilateral)  Due to the fact that the patient reports unsafe transfers and ambulation it is medically necessary for the patient to stay until that status improves.  She is to work with therapy today.  If improvement is made the nurse is to call and I will place the D/C order.  Plan for eventual D/C to home. Up  with therapy  WBAT Scripts on chart Plan for 2 week outpatient post-op visit with Dr. Meda CoffeeBeane  Hinley Brimage, PA-C, ATC [facility] Office:  [phone]

## 2016-09-27 NOTE — Progress Notes (Signed)
Occupational Therapy Treatment Patient Details Name: [name] MRN: [id number] DOB: 04/08/1951 Today's Date: 09/27/2016    History of present illness Pt s/p L 3-4 microlumbar decompression with lateral mass fusion   OT comments  Pt making progress but moving slowly. Pt completing ADL with overall min A. Will need to use AE to be modified independent. Feel pt will benefit from continued services acutely prior to DC. Will continue to follow acutely to complete education on ADL and functional mobility for ADL with use of compensatory techniques, AE and DME.   Follow Up Recommendations  No OT follow up;Supervision/Assistance - 24 hour    Equipment Recommendations  3 in 1 bedside commode    Recommendations for Other Services      Precautions / Restrictions Precautions Precautions: Fall;Back Required Braces or Orthoses: Spinal Brace Spinal Brace: Applied in sitting position       Mobility Bed Mobility Overal bed mobility: Needs Assistance Bed Mobility: Rolling;Sidelying to Sit Rolling: Modified independent (Device/Increase time) Sidelying to sit: Supervision       General bed mobility comments: Pt asking for therapist to pull on pad to help move to EOB. Educated pt on need for her to learn how to move in bed without the use of the bed pad. Pt verbalized understnading and able to move to EOB with vc only  Transfers                      Balance                                   ADL Overall ADL's : Needs assistance/impaired     Grooming: Set up;Supervision/safety Grooming Details (indicate cue type and reason): Educated on back precautions during grooming tasks     Lower Body Bathing: Minimal assistance Lower Body Bathing Details (indicate cue type and reason): Difficulty lifting LLE and unable to cross over leg at this time. May benefit form use of AE. Able to complete pericare without AE.      Lower Body Dressing: Minimal assistance;Sit  to/from stand Lower Body Dressing Details (indicate cue type and reason): Ablet o adjust R sock with out AE. Will need AE for L sock Toilet Transfer: Supervision/safety;RW;BSC   Toileting- Clothing Manipulation and Hygiene: Supervision/safety;Sit to/from stand       Functional mobility during ADLs: Supervision/safety;Rolling walker General ADL Comments: Pt able to recall 3/3 back precautions. Moving very slowly. Will need to practice shower transfer      Vision                     Perception     Praxis      Cognition   Behavior During Therapy: [facility] for tasks assessed/performed Overall Cognitive Status: Within Functional Limits for tasks assessed                         Exercises     Shoulder Instructions       General Comments      Pertinent Vitals/ Pain       Pain Assessment: 0-10 Pain Score: 7  Pain Location: back Pain Descriptors / Indicators: Aching;Sore Pain Intervention(s): Limited activity within patient's tolerance;RN gave pain meds during session  Home Living  Prior Functioning/Environment              Frequency  Min 2X/week        Progress Toward Goals  OT Goals(current goals can now be found in the care plan section)  Progress towards OT goals: Progressing toward goals  Acute Rehab OT Goals Patient Stated Goal: to go home when moving better OT Goal Formulation: With patient Time For Goal Achievement: 10/09/16 ADL Goals Pt Will Perform Grooming: with modified independence;sitting;standing Pt Will Perform Lower Body Bathing: with modified independence;sit to/from stand Pt Will Perform Lower Body Dressing: with modified independence;sit to/from stand;with adaptive equipment Pt Will Transfer to Toilet: with modified independence;regular height toilet;ambulating Pt Will Perform Toileting - Clothing Manipulation and hygiene: with modified independence;sit to/from  stand;with adaptive equipment Pt Will Perform Tub/Shower Transfer: with modified independence;rolling walker;Shower transfer  Plan Discharge plan remains appropriate    Co-evaluation                 End of Session Equipment Utilized During Treatment: Rolling walker;Gait belt;Back brace  OT Visit Diagnosis: Muscle weakness (generalized) (M62.81);Pain Pain - part of body:  (back)   Activity Tolerance Patient tolerated treatment well   Patient Left in chair;with call bell/phone within reach   Nurse Communication Mobility status        Time: 0828-0902 OT Time Calculation (min): 34 min  Charges: OT General Charges $OT Visit: 1 Procedure OT Treatments $Self Care/Home Management : 23-37 mins  [facility], OT/L  [phone] 09/27/2016   Calin Ellery,HILLARY 09/27/2016, 9:10 AM

## 2016-09-27 NOTE — Progress Notes (Signed)
Physical Therapy Treatment Patient Details Name: [name] MRN: [id number] DOB: 11-Nov-1950 Today's Date: 09/27/2016    History of Present Illness Pt s/p L 3-4 microlumbar decompression with lateral mass fusion    PT Comments    Pt progressing well with mobility but very apprehensive re dc home.   Follow Up Recommendations  No PT follow up     Equipment Recommendations  Rolling walker with 5" wheels    Recommendations for Other Services OT consult     Precautions / Restrictions Precautions Precautions: Fall;Back Precaution Booklet Issued: Yes (comment) Precaution Comments: Back precautions reviewed  Required Braces or Orthoses: Spinal Brace Spinal Brace: Applied in sitting position Restrictions Weight Bearing Restrictions: No    Mobility  Bed Mobility Overal bed mobility: Needs Assistance Bed Mobility: Rolling;Sidelying to Sit Rolling: Modified independent (Device/Increase time) Sidelying to sit: Supervision       General bed mobility comments: Pt OOB with OT and requests back to chair  Transfers Overall transfer level: Needs assistance Equipment used: Rolling walker (2 wheeled) Transfers: Sit to/from Stand Sit to Stand: Supervision Stand pivot transfers: Supervision       General transfer comment: increased time with cues for hand placement  Ambulation/Gait Ambulation/Gait assistance: Min guard;Supervision Ambulation Distance (Feet): 250 Feet Assistive device: Rolling walker (2 wheeled) Gait Pattern/deviations: Step-through pattern;Decreased step length - left;Decreased step length - right;Shuffle;Trunk flexed Gait velocity: decr Gait velocity interpretation: Below normal speed for age/gender General Gait Details: min cues for posture and position from RW   Stairs Stairs: Yes   Stair Management: No rails;Step to pattern;Forwards;With walker Number of Stairs: 2 General stair comments: single step twice with RW and assist to move RW to next  level  Wheelchair Mobility    Modified Rankin (Stroke Patients Only)       Balance                                    Cognition Arousal/Alertness: Awake/alert Behavior During Therapy: WFL for tasks assessed/performed Overall Cognitive Status: Within Functional Limits for tasks assessed                      Exercises      General Comments        Pertinent Vitals/Pain Pain Assessment: 0-10 Pain Score: 6  Pain Location: back Pain Descriptors / Indicators: Aching;Sore Pain Intervention(s): Limited activity within patient's tolerance;Monitored during session;Premedicated before session    Home Living                      Prior Function            PT Goals (current goals can now be found in the care plan section) Acute Rehab PT Goals Patient Stated Goal: to go home when moving better PT Goal Formulation: With patient Time For Goal Achievement: 09/30/16 Potential to Achieve Goals: Good Progress towards PT goals: Progressing toward goals    Frequency    7X/week      PT Plan Current plan remains appropriate    Co-evaluation             End of Session Equipment Utilized During Treatment: Back brace Activity Tolerance: Patient tolerated treatment well Patient left: in chair;with call bell/phone within reach;with family/visitor present Nurse Communication: Mobility status PT Visit Diagnosis: Difficulty in walking, not elsewhere classified (R26.2)     Time: [id number] PT Time Calculation (min) (  ACUTE ONLY): 27 min  Charges:  $Gait Training: 23-37 mins                    G Codes:       [name] 2016/10/08, 10:25 AM

## 2016-09-27 NOTE — Care Management Note (Signed)
Case Management Note  Patient Details  Name: Randa EvensJudy Go MRN: [id number] Date of Birth: 08/18/1950  Subjective/Objective:     S/p L 3-4 microlumbar decompresson               Action/Plan: Discharge Planning: NCM spoke to pt and scheduled for dc home today. Pt has RW and 3n1 bedside commode. States she has family support at home to assist as she needs.   PCP Heide GuileKARWOWSKI, CHARITY LYNN  Expected Discharge Date:  09/25/16               Expected Discharge Plan:  Home/Self Care  In-House Referral:  NA  Discharge planning Services  CM Consult  Post Acute Care Choice:  NA Choice offered to:  NA  DME Arranged:  N/A DME Agency:  NA  HH Arranged:  NA HH Agency:  NA  Status of Service:  Completed, signed off  If discussed at Long Length of Stay Meetings, dates discussed:    Additional Comments:  Elliot CousinShavis, Janae Bonser Ellen, RN 09/27/2016, 2:11 PM

## 2016-09-27 NOTE — Progress Notes (Signed)
Physical Therapy Treatment Patient Details Name: [name] MRN: [id number] DOB: 1951/07/18 Today's Date: 09/27/2016    History of Present Illness Pt s/p L 3-4 microlumbar decompression with lateral mass fusion    PT Comments    Pt progressing well with mobility and feeling much more confident with ability to manage at home.  Pt mobilizing and performing bed mobility at sup level and managing in bathroom unassisted.   Follow Up Recommendations  No PT follow up     Equipment Recommendations  Rolling walker with 5" wheels    Recommendations for Other Services OT consult     Precautions / Restrictions Precautions Precautions: Fall;Back Precaution Booklet Issued: Yes (comment) Precaution Comments: Back precautions reviewed  Required Braces or Orthoses: Spinal Brace Spinal Brace: Applied in sitting position Restrictions Weight Bearing Restrictions: No    Mobility  Bed Mobility Overal bed mobility: Needs Assistance Bed Mobility: Sit to Supine Rolling: Modified independent (Device/Increase time)     Sit to supine: Supervision   General bed mobility comments: min cues for sequence but no physical assist required to complete task  Transfers Overall transfer level: Needs assistance Equipment used: Rolling walker (2 wheeled) Transfers: Sit to/from Stand Sit to Stand: Supervision Stand pivot transfers: Supervision       General transfer comment: pt self cues for transition position and use of UEs to self assist  Ambulation/Gait Ambulation/Gait assistance: Supervision Ambulation Distance (Feet): 300 Feet Assistive device: Rolling walker (2 wheeled) Gait Pattern/deviations: Step-through pattern;Decreased step length - left;Decreased step length - right;Shuffle;Trunk flexed Gait velocity: decr Gait velocity interpretation: Below normal speed for age/gender General Gait Details: min cues for posture and position from [employer]            Wheelchair  Mobility    Modified Rankin (Stroke Patients Only)       Balance                                    Cognition Arousal/Alertness: Awake/alert Behavior During Therapy: WFL for tasks assessed/performed Overall Cognitive Status: Within Functional Limits for tasks assessed                      Exercises      General Comments        Pertinent Vitals/Pain Pain Assessment: 0-10 Pain Score: 5  Pain Location: back Pain Descriptors / Indicators: Aching;Sore Pain Intervention(s): Limited activity within patient's tolerance;Monitored during session;Premedicated before session;Ice applied    Home Living                      Prior Function            PT Goals (current goals can now be found in the care plan section) Acute Rehab PT Goals Patient Stated Goal: Regain IND PT Goal Formulation: With patient Time For Goal Achievement: 09/30/16 Potential to Achieve Goals: Good Progress towards PT goals: Progressing toward goals    Frequency    7X/week      PT Plan Current plan remains appropriate    Co-evaluation             End of Session Equipment Utilized During Treatment: Back brace Activity Tolerance: Patient tolerated treatment well Patient left: in bed;with call bell/phone within reach Nurse Communication: Mobility status PT Visit Diagnosis: Difficulty in walking, not elsewhere classified (R26.2)     Time: 7829-5621 PT Time  Calculation (min) (ACUTE ONLY): 22 min  Charges:  $Gait Training: 8-22 mins                    G Codes:       Granvil Djordjevic 09/27/2016, 2:17 PM

## 2016-09-27 NOTE — Progress Notes (Signed)
Received call from nurse case manager and message from nurse that the patient is ready for discharge.  I will promptly place the D/C order.  Alfredo MartinezJustin Santino Kinsella, PA-C [facility] Office:  [phone]

## 2016-09-30 NOTE — Discharge Summary (Signed)
Physician Discharge Summary   Patient ID: [name] MRN: [id number] DOB/AGE: [birth year] 66 y.o.  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 Anemia    borderline   . Family history of adverse reaction to anesthesia    brother- slow to wake up    Discharge Diagnoses:   Principal Problem:   Spinal stenosis of lumbar region Active Problems:   Spinal stenosis at L4-L5 level  Procedure:  Procedure(s) (LRB): Microlumbar decompression L3-4, lateral mass fusion using autograft bone graft, and allograft bone graft (Bilateral)   Consults: None  HPI:  see H&P    Laboratory Data: Hospital Outpatient Visit on 09/17/2016  Component Date Value Ref Range Status  . ABO/RH(D) 09/17/2016 O POS   Final   No results for input(s): HGB in the last 72 hours. No results for input(s): WBC, RBC, HCT, PLT in the last 72 hours. No results for input(s): NA, K, CL, CO2, BUN, CREATININE, GLUCOSE, CALCIUM in the last 72 hours. No results for input(s): LABPT, INR in the last 72 hours.  X-Rays:Dg Lumbar Spine 2-3 Views  Result Date: 09/17/2016 CLINICAL DATA:  Preoperative examination prior to disc surgery. EXAM: LUMBAR SPINE - 2-3 VIEW COMPARISON:  None in PACs FINDINGS: There are 5 lumbar type non-rib-bearing vertebral bodies present. The vertebral bodies are preserved in height. There is mild disc space narrowing at L3-4 and at L5-S1. There is grade 1 anterolisthesis of L3 with respect to L4. There is no significant facet joint hypertrophy. The pedicles and transverse processes are intact. The observed portions of the sacrum are normal. IMPRESSION: There are degenerative changes of the lumbar disc levels as described. There is no compression fracture nor other acute bony abnormality. Electronically Signed   By: David  SwazilandJordan M.D.   On: 09/17/2016 13:54   Dg Spine  Portable 1 View  Result Date: 09/24/2016 CLINICAL DATA:  Localization film 4 EXAM: PORTABLE SPINE - 1 VIEW COMPARISON:  Prior film same day lateral view of the lumbar spine FINDINGS: Single lateral view of the lumbar spine submitted. There is a lower approach posterior metallic probe with the tip at the level of L3 pedicle. A second posterior metallic probe with superior approach with the tip at the level of L4 pedicle. Again noted disc space flattening at L5-S1 level. IMPRESSION: L3-L4 localization as described Electronically Signed   By: Natasha MeadLiviu  Pop M.D.   On: 09/24/2016 10:51   Dg Spine Portable 1 View  Result Date: 09/24/2016 CLINICAL DATA:  L3-4 lumbar decompression.  Intraoperative image 2. EXAM: PORTABLE SPINE - 1 VIEW COMPARISON:  Earlier study and radiographs 09/17/2016. FINDINGS: 0842 hour. Image labeled image 2. Surgical instruments overlying the L2 and L3 spinous processes. There are skin spreaders posteriorly at the L2-3 level. Anterolisthesis noted at the L3-4 level. IMPRESSION: Intraoperative localization as described. Skin spreaders are at L2-3. Electronically Signed   By: Carey BullocksWilliam  Veazey M.D.   On: 09/24/2016 09:47   Dg Spine Portable 1 View  Result Date: 09/24/2016 CLINICAL DATA:  Intraoperative localization film in patient for L3-4 lumbar decompression. EXAM: PORTABLE SPINE - 1 VIEW COMPARISON:  Two views lumbar spine 09/17/2016. Intraoperative localization films earlier today. FINDINGS: Two probes are identified. The more superior is directed toward the L3-4 interspace. The more inferior is just below the level of the L4 pedicles. A single clamp is seen on the L3 and superior aspect of  the L4 spinous processes. IMPRESSION: Localization as above. Electronically Signed   By: Drusilla Kanner M.D.   On: 09/24/2016 09:45   Dg Spine Portable 1 View  Result Date: 09/24/2016 CLINICAL DATA:  Lumbar decompression L3-4.  Intraoperative film 1. EXAM: PORTABLE SPINE - 1 VIEW COMPARISON:  Lumbar  spine radiographs 09/17/2016. FINDINGS: Cross-table lateral view at 0833 hours labeled image 1. There is a degenerative anterolisthesis at L3-4 with stable disc space narrowing at L3-4 and L5-S1. Posterior localizing needles are located at the level of the L3 and L4 spinous processes. IMPRESSION: Intraoperative localization as described. Electronically Signed   By: Carey Bullocks M.D.   On: 09/24/2016 09:45    EKG: Orders placed or performed during the hospital encounter of 09/24/16  . EKG 12-Lead  . EKG 12-Lead  . EKG 12-Lead  . EKG 12-Lead     Hospital Course: Patient was admitted to [facility] and taken to the OR and underwent the above state procedure without complications.  Patient tolerated the procedure well and was later transferred to the recovery room and then to the orthopaedic floor for postoperative care.  They were given PO and IV analgesics for pain control following their surgery.  They were given 24 hours of postoperative antibiotics.   PT was consulted postop to assist with mobility and transfers.  The patient was allowed to be WBAT with therapy and was taught back precautions. Discharge planning was consulted to help with postop disposition and equipment needs.  Patient had a fair night on the evening of surgery and started to get up OOB with therapy on day one, limited for post op day 1 and 2 due to hypotension and nausea. Patient was seen in rounds and was ready to go home on day three.  They were given discharge instructions and dressing directions.  They were instructed on when to follow up in the office with Dr. Shelle Iron.   Diet: Regular diet Activity:WBAT; Lspine precautions Follow-up:in 10-14 days Disposition - Home Discharged Condition: good   Discharge Instructions    Call MD / Call 911    Complete by:  As directed    If you experience chest pain or shortness of breath, CALL 911 and be transported to the hospital emergency room.  If you develope a fever  above 101 F, pus (white drainage) or increased drainage or redness at the wound, or calf pain, call your surgeon's office.   Constipation Prevention    Complete by:  As directed    Drink plenty of fluids.  Prune juice may be helpful.  You may use a stool softener, such as Colace (over the counter) 100 mg twice a day.  Use MiraLax (over the counter) for constipation as needed.   Diet - low sodium heart healthy    Complete by:  As directed    Increase activity slowly as tolerated    Complete by:  As directed    Weight bearing as tolerated    Complete by:  As directed      Allergies as of 09/27/2016   No Known Allergies     Medication List    STOP taking these medications   Biotin 5000 MCG Caps   FISH OIL PO   OVER THE COUNTER MEDICATION     TAKE these medications   ALKA-SELTZER PLUS ALLERGY PO Take by mouth.   docusate sodium 100 MG capsule Commonly known as:  COLACE Take 1 capsule (100 mg total) by mouth 2 (two) times  daily as needed for mild constipation.   doxylamine (Sleep) 25 MG tablet Commonly known as:  UNISOM Take 25 mg by mouth at bedtime.   ibuprofen 200 MG tablet Commonly known as:  ADVIL,MOTRIN Take 2 tablets (400 mg total) by mouth 3 (three) times daily. May resume 5 days post-op as needed. May take an additional 200mg s during the day as needed for pain What changed:  when to take this  additional instructions   oxyCODONE-acetaminophen 5-325 MG tablet Commonly known as:  PERCOCET Take 1-2 tablets by mouth every 4 (four) hours as needed for severe pain.   polyethylene glycol packet Commonly known as:  MIRALAX / GLYCOLAX Take 17 g by mouth daily.   VITAMIN D PO Take 2 tablets by mouth daily.      Follow-up Information    BEANE,JEFFREY C, MD Follow up in 2 week(s).   Specialty:  Orthopedic Surgery Contact information: [address] [place] [address] [phone]           Signed: Andrez Grime, PA-C Orthopaedic  Surgery 09/30/2016, 11:13 AM

## 2018-12-02 IMAGING — DX DG SPINE 1V PORT
1 series · 1 of 1 positions shown · non-contrast
Comparison: Earlier study and radiographs 09/17/2016.

CLINICAL DATA: L3-4 lumbar decompression.  Intraoperative image 2.

EXAM:
PORTABLE SPINE - 1 VIEW

[l-spine lat]
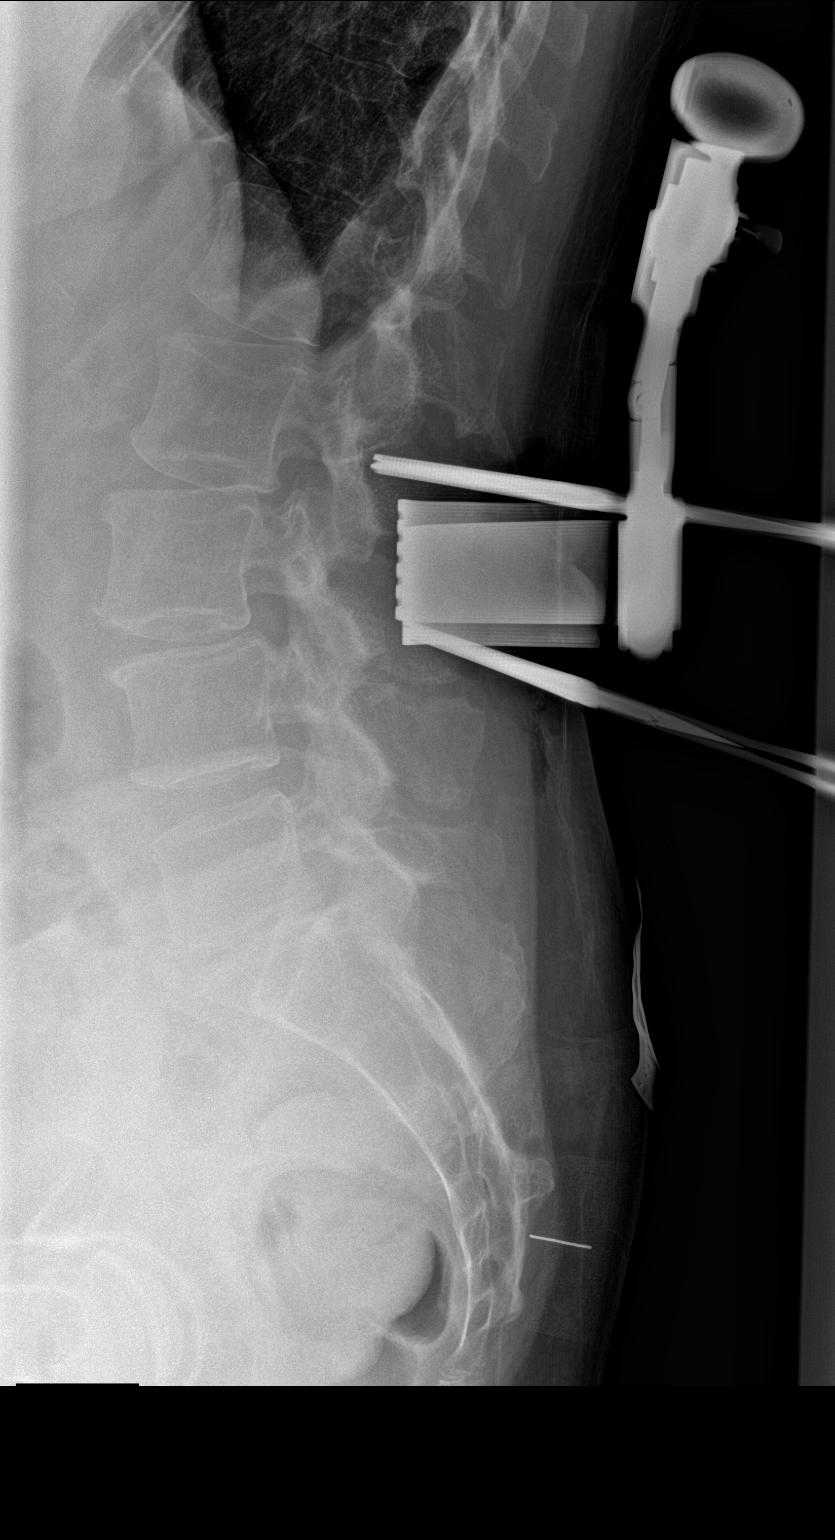

[1 of 1 positions shown; findings below may reference images not displayed]

FINDINGS: 3492 hour. Image labeled image 2. Surgical instruments overlying the
L2 and L3 spinous processes. There are skin spreaders posteriorly at
the L2-3 level. Anterolisthesis noted at the L3-4 level.
IMPRESSION: Intraoperative localization as described. Skin spreaders are at
L2-3.
